# Patient Record
Sex: Female | Born: 1990 | Race: Black or African American | Hispanic: Yes | Marital: Single | Smoking: Former smoker
Health system: Southern US, Community
[De-identification: ages and names within clinical notes are randomized; demographics above are authoritative.]

## PROBLEM LIST (undated history)

## (undated) ENCOUNTER — Inpatient Hospital Stay (HOSPITAL_COMMUNITY): Payer: Self-pay

## (undated) DIAGNOSIS — O9081 Anemia of the puerperium: Secondary | ICD-10-CM

## (undated) DIAGNOSIS — Z3042 Encounter for surveillance of injectable contraceptive: Secondary | ICD-10-CM

## (undated) DIAGNOSIS — O039 Complete or unspecified spontaneous abortion without complication: Secondary | ICD-10-CM

## (undated) DIAGNOSIS — A749 Chlamydial infection, unspecified: Secondary | ICD-10-CM

## (undated) DIAGNOSIS — R011 Cardiac murmur, unspecified: Secondary | ICD-10-CM

## (undated) DIAGNOSIS — D649 Anemia, unspecified: Secondary | ICD-10-CM

## (undated) DIAGNOSIS — F329 Major depressive disorder, single episode, unspecified: Secondary | ICD-10-CM

## (undated) DIAGNOSIS — Z1379 Encounter for other screening for genetic and chromosomal anomalies: Secondary | ICD-10-CM

## (undated) DIAGNOSIS — B999 Unspecified infectious disease: Secondary | ICD-10-CM

## (undated) DIAGNOSIS — A549 Gonococcal infection, unspecified: Secondary | ICD-10-CM

## (undated) DIAGNOSIS — Z809 Family history of malignant neoplasm, unspecified: Secondary | ICD-10-CM

## (undated) HISTORY — DX: Family history of malignant neoplasm, unspecified: Z80.9

## (undated) HISTORY — DX: Encounter for other screening for genetic and chromosomal anomalies: Z13.79

## (undated) HISTORY — PX: WISDOM TOOTH EXTRACTION: SHX21

## (undated) HISTORY — DX: Cardiac murmur, unspecified: R01.1

## (undated) HISTORY — DX: Anemia, unspecified: D64.9

## (undated) HISTORY — DX: Unspecified infectious disease: B99.9

---
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
## 2009-12-13 DIAGNOSIS — F32A Depression, unspecified: Secondary | ICD-10-CM

## 2009-12-13 HISTORY — DX: Depression, unspecified: F32.A

## 2010-02-23 ENCOUNTER — Emergency Department (HOSPITAL_COMMUNITY): Admission: EM | Admit: 2010-02-23 | Discharge: 2010-02-23 | Payer: Self-pay | Admitting: Family Medicine

## 2010-04-14 ENCOUNTER — Ambulatory Visit: Payer: Self-pay | Admitting: Family Medicine

## 2010-04-14 ENCOUNTER — Encounter: Payer: Self-pay | Admitting: Emergency Medicine

## 2010-04-14 ENCOUNTER — Inpatient Hospital Stay (HOSPITAL_COMMUNITY): Admission: AD | Admit: 2010-04-14 | Discharge: 2010-04-14 | Payer: Self-pay | Admitting: Obstetrics and Gynecology

## 2010-05-13 ENCOUNTER — Ambulatory Visit: Payer: Self-pay | Admitting: Obstetrics and Gynecology

## 2010-05-14 ENCOUNTER — Encounter: Payer: Self-pay | Admitting: Obstetrics and Gynecology

## 2010-12-10 ENCOUNTER — Emergency Department (HOSPITAL_COMMUNITY)
Admission: EM | Admit: 2010-12-10 | Discharge: 2010-12-10 | Payer: Self-pay | Source: Home / Self Care | Admitting: Family Medicine

## 2010-12-13 DIAGNOSIS — D649 Anemia, unspecified: Secondary | ICD-10-CM

## 2010-12-13 HISTORY — DX: Anemia, unspecified: D64.9

## 2011-01-03 ENCOUNTER — Encounter: Payer: Self-pay | Admitting: *Deleted

## 2011-02-22 LAB — CBC
HCT: 45.3 % (ref 36.0–46.0)
Hemoglobin: 14.5 g/dL (ref 12.0–15.0)
MCHC: 32 g/dL (ref 30.0–36.0)
MCV: 81.2 fL (ref 78.0–100.0)
Platelets: 367 10*3/uL (ref 150–400)

## 2011-02-22 LAB — TSH: TSH: 1.003 u[IU]/mL (ref 0.350–4.500)

## 2011-02-22 LAB — GC/CHLAMYDIA PROBE AMP, GENITAL: Chlamydia, DNA Probe: NEGATIVE

## 2011-03-02 LAB — BASIC METABOLIC PANEL
Creatinine, Ser: 0.57 mg/dL (ref 0.4–1.2)
GFR calc non Af Amer: >60 mL/min (ref >60)
Glucose, Bld: 94 mg/dL (ref 70–99)

## 2011-03-02 LAB — URINALYSIS, ROUTINE W REFLEX MICROSCOPIC
Bilirubin Urine: NEGATIVE
Glucose, UA: NEGATIVE mg/dL
Ketones, ur: NEGATIVE mg/dL
Protein, ur: NEGATIVE mg/dL
Specific Gravity, Urine: 1.023 (ref 1.005–1.030)
Urobilinogen, UA: 1 mg/dL (ref 0.0–1.0)
pH: 6.5 (ref 5.0–8.0)

## 2011-03-02 LAB — CBC
Hemoglobin: 12.4 g/dL (ref 12.0–15.0)
RBC: 4.37 MIL/uL (ref 3.87–5.11)
RDW: 14.8 % (ref 11.5–15.5)
WBC: 6.2 10*3/uL (ref 4.0–10.5)

## 2011-03-02 LAB — WET PREP, GENITAL
Trich, Wet Prep: NONE SEEN
Yeast Wet Prep HPF POC: NONE SEEN

## 2011-03-02 LAB — HCG, QUANTITATIVE, PREGNANCY: hCG, Beta Chain, Quant, S: 4173 m[IU]/mL — ABNORMAL HIGH (ref <5)

## 2011-03-02 LAB — DIFFERENTIAL
Eosinophils Absolute: 0.2 10*3/uL (ref 0.0–0.7)
Eosinophils Relative: 3 % (ref 0–5)
Lymphocytes Relative: 25 % (ref 12–46)
Monocytes Absolute: 0.5 10*3/uL (ref 0.1–1.0)
Neutro Abs: 3.9 10*3/uL (ref 1.7–7.7)
Neutrophils Relative %: 63 % (ref 43–77)

## 2011-03-02 LAB — GC/CHLAMYDIA PROBE AMP, GENITAL: GC Probe Amp, Genital: NEGATIVE

## 2011-03-02 LAB — URINE MICROSCOPIC-ADD ON

## 2011-03-02 LAB — ABO/RH: ABO/RH(D): A POS

## 2011-03-08 LAB — WET PREP, GENITAL
Trich, Wet Prep: NONE SEEN
Yeast Wet Prep HPF POC: NONE SEEN

## 2011-03-08 LAB — POCT URINALYSIS DIP (DEVICE)
Hgb urine dipstick: NEGATIVE
Ketones, ur: NEGATIVE mg/dL
Protein, ur: NEGATIVE mg/dL
Specific Gravity, Urine: 1.02 (ref 1.005–1.030)
pH: 5.5 (ref 5.0–8.0)

## 2011-03-08 LAB — GC/CHLAMYDIA PROBE AMP, GENITAL: GC Probe Amp, Genital: NEGATIVE

## 2011-03-26 ENCOUNTER — Inpatient Hospital Stay (INDEPENDENT_AMBULATORY_CARE_PROVIDER_SITE_OTHER)
Admission: RE | Admit: 2011-03-26 | Discharge: 2011-03-26 | Disposition: A | Discharge status: Home / Self Care | Payer: Self-pay | Source: Ambulatory Visit | Attending: Emergency Medicine | Admitting: Emergency Medicine

## 2011-03-26 DIAGNOSIS — J069 Acute upper respiratory infection, unspecified: Secondary | ICD-10-CM

## 2011-03-26 LAB — CBC
MCH: 28.4 pg (ref 26.0–34.0)
MCHC: 33.8 g/dL (ref 30.0–36.0)
MCV: 83.9 fL (ref 78.0–100.0)
RBC: 4.97 MIL/uL (ref 3.87–5.11)
RDW: 13.2 % (ref 11.5–15.5)
WBC: 7.3 10*3/uL (ref 4.0–10.5)

## 2011-03-26 LAB — POCT URINALYSIS DIP (DEVICE)
Glucose, UA: NEGATIVE mg/dL
Nitrite: NEGATIVE
Urobilinogen, UA: 1 mg/dL (ref 0.0–1.0)
pH: 7 (ref 5.0–8.0)

## 2011-03-26 LAB — DIFFERENTIAL
Basophils Relative: 0 % (ref 0–1)
Eosinophils Absolute: 0 10*3/uL (ref 0.0–0.7)
Eosinophils Relative: 0 % (ref 0–5)
Monocytes Relative: 10 % (ref 3–12)
Neutrophils Relative %: 85 % — ABNORMAL HIGH (ref 43–77)

## 2011-10-15 ENCOUNTER — Emergency Department (HOSPITAL_COMMUNITY)
Admission: EM | Admit: 2011-10-15 | Discharge: 2011-10-15 | Disposition: A | Discharge status: Home / Self Care | Payer: Self-pay | Attending: Emergency Medicine | Admitting: Emergency Medicine

## 2011-10-15 DIAGNOSIS — H6690 Otitis media, unspecified, unspecified ear: Secondary | ICD-10-CM | POA: Insufficient documentation

## 2011-10-15 DIAGNOSIS — H9209 Otalgia, unspecified ear: Secondary | ICD-10-CM | POA: Insufficient documentation

## 2011-10-15 DIAGNOSIS — R059 Cough, unspecified: Secondary | ICD-10-CM | POA: Insufficient documentation

## 2011-10-15 DIAGNOSIS — J3489 Other specified disorders of nose and nasal sinuses: Secondary | ICD-10-CM | POA: Insufficient documentation

## 2011-10-15 DIAGNOSIS — J069 Acute upper respiratory infection, unspecified: Secondary | ICD-10-CM | POA: Insufficient documentation

## 2011-10-15 DIAGNOSIS — R070 Pain in throat: Secondary | ICD-10-CM | POA: Insufficient documentation

## 2011-10-15 DIAGNOSIS — R05 Cough: Secondary | ICD-10-CM | POA: Insufficient documentation

## 2011-11-08 ENCOUNTER — Inpatient Hospital Stay (HOSPITAL_COMMUNITY)
Admission: AD | Admit: 2011-11-08 | Discharge: 2011-11-08 | Disposition: A | Discharge status: Home / Self Care | Payer: Self-pay | Source: Ambulatory Visit | Attending: Obstetrics & Gynecology | Admitting: Obstetrics & Gynecology

## 2011-11-08 NOTE — ED Notes (Signed)
Pt not in lobby.  

## 2011-11-08 NOTE — Progress Notes (Signed)
Not in lobby

## 2011-11-09 ENCOUNTER — Inpatient Hospital Stay (HOSPITAL_COMMUNITY): Payer: Medicaid Other

## 2011-11-09 ENCOUNTER — Encounter (HOSPITAL_COMMUNITY): Payer: Self-pay | Admitting: *Deleted

## 2011-11-09 ENCOUNTER — Inpatient Hospital Stay (HOSPITAL_COMMUNITY)
Admission: AD | Admit: 2011-11-09 | Discharge: 2011-11-09 | Disposition: A | Discharge status: Home / Self Care | Payer: Medicaid Other | Source: Ambulatory Visit | Attending: Obstetrics & Gynecology | Admitting: Obstetrics & Gynecology

## 2011-11-09 DIAGNOSIS — Z3480 Encounter for supervision of other normal pregnancy, unspecified trimester: Secondary | ICD-10-CM

## 2011-11-09 DIAGNOSIS — N760 Acute vaginitis: Secondary | ICD-10-CM | POA: Insufficient documentation

## 2011-11-09 DIAGNOSIS — B9689 Other specified bacterial agents as the cause of diseases classified elsewhere: Secondary | ICD-10-CM | POA: Insufficient documentation

## 2011-11-09 DIAGNOSIS — R109 Unspecified abdominal pain: Secondary | ICD-10-CM | POA: Insufficient documentation

## 2011-11-09 DIAGNOSIS — A499 Bacterial infection, unspecified: Secondary | ICD-10-CM | POA: Insufficient documentation

## 2011-11-09 DIAGNOSIS — O2390 Unspecified genitourinary tract infection in pregnancy, unspecified trimester: Secondary | ICD-10-CM | POA: Insufficient documentation

## 2011-11-09 HISTORY — DX: Complete or unspecified spontaneous abortion without complication: O03.9

## 2011-11-09 HISTORY — DX: Major depressive disorder, single episode, unspecified: F32.9

## 2011-11-09 HISTORY — DX: Chlamydial infection, unspecified: A74.9

## 2011-11-09 HISTORY — DX: Gonococcal infection, unspecified: A54.9

## 2011-11-09 LAB — CBC
HCT: 35.8 % — ABNORMAL LOW (ref 36.0–46.0)
Hemoglobin: 11.3 g/dL — ABNORMAL LOW (ref 12.0–15.0)
MCV: 82.7 fL (ref 78.0–100.0)
RBC: 4.33 MIL/uL (ref 3.87–5.11)
RDW: 15.2 % (ref 11.5–15.5)
WBC: 5.4 10*3/uL (ref 4.0–10.5)

## 2011-11-09 LAB — URINALYSIS, ROUTINE W REFLEX MICROSCOPIC
Bilirubin Urine: NEGATIVE
Hgb urine dipstick: NEGATIVE
Specific Gravity, Urine: 1.02 (ref 1.005–1.030)
Urobilinogen, UA: 0.2 mg/dL (ref 0.0–1.0)

## 2011-11-09 LAB — WET PREP, GENITAL
Trich, Wet Prep: NONE SEEN
Yeast Wet Prep HPF POC: NONE SEEN

## 2011-11-09 MED ORDER — PRENATAL RX 60-1 MG PO TABS: 1.0000 | ORAL_TABLET | Freq: Every day | ORAL | Status: DC

## 2011-11-09 MED ORDER — METRONIDAZOLE 500 MG PO TABS: 500.0000 mg | ORAL_TABLET | Freq: Two times a day (BID) | ORAL | Status: AC

## 2011-11-09 MED ORDER — METRONIDAZOLE 500 MG PO TABS: 500.0000 mg | ORAL_TABLET | Freq: Once | ORAL | Status: DC

## 2011-11-09 NOTE — Progress Notes (Signed)
Not sure if preg.  Missed period. Had SAB over a year ago.  Low back pain - started 2 days ago.  Stomach hurts- cramping, started 4-5 days ago. Has been nauseated.

## 2011-11-09 NOTE — ED Provider Notes (Signed)
Durojayaih-Tanginika 89 y.o.G2P0010 @[redacted]w[redacted]d  cc: abd pain  SUBJECTIVE  HPI:  Past Medical History  Diagnosis Date  . Miscarriage   . Depression   . Chlamydia   . Gonorrhea    History reviewed. No pertinent past surgical history. History   Social History  . Marital Status: Single    Spouse Name: N/A    Number of Children: N/A  . Years of Education: N/A   Occupational History  . Not on file.   Social History Main Topics  . Smoking status: Current Everyday Smoker -- 0.2 packs/day  . Smokeless tobacco: Not on file  . Alcohol Use: No  . Drug Use: No  . Sexually Active:    Other Topics Concern  . Not on file   Social History Narrative  . No narrative on file   No current facility-administered medications on file prior to encounter.   No current outpatient prescriptions on file prior to encounter.   No Known Allergies  ROS: Pertinent items in HPI  OBJECTIVE Physical Exam  Constitutional: She is oriented to person, place, and time and well-developed, well-nourished, and in no distress. No distress.  HENT:  Head: Normocephalic.  Neck: Neck supple.  Cardiovascular: Regular rhythm.   Pulmonary/Chest: Effort normal and breath sounds normal.  Abdominal: Soft. She exhibits no mass. There is no tenderness. There is no rebound and no guarding.  Genitourinary: Uterus normal, cervix normal, right adnexa normal and left adnexa normal. Vaginal discharge found.       Copious amt creamy white-grey discharge  Musculoskeletal: Normal range of motion.  Neurological: She is alert and oriented to person, place, and time.  Skin: Skin is warm and dry.  Psychiatric: Affect normal.   BP 103/76  Pulse 56  Temp(Src) 98.8 F (37.1 C) (Oral)  Resp 18  Ht 5\' 4"  (1.626 m)  Wt 66.225 kg (146 lb)  BMI 25.06 kg/m2  SpO2 99%  LMP 09/24/2011  Results for orders placed during the hospital encounter of 11/09/11 (from the past 24 hour(s))  URINALYSIS, ROUTINE W REFLEX MICROSCOPIC      Status: Normal   Collection Time   11/09/11 11:28 AM      Component Value Range   Color, Urine YELLOW  YELLOW    Appearance CLEAR  CLEAR    Specific Gravity, Urine 1.020  1.005 - 1.030    pH 7.0  5.0 - 8.0    Glucose, UA NEGATIVE  NEGATIVE (mg/dL)   Hgb urine dipstick NEGATIVE  NEGATIVE    Bilirubin Urine NEGATIVE  NEGATIVE    Ketones, ur NEGATIVE  NEGATIVE (mg/dL)   Protein, ur NEGATIVE  NEGATIVE (mg/dL)   Urobilinogen, UA 0.2  0.0 - 1.0 (mg/dL)   Nitrite NEGATIVE  NEGATIVE    Leukocytes, UA NEGATIVE  NEGATIVE   POCT PREGNANCY, URINE     Status: Normal   Collection Time   11/09/11 11:32 AM      Component Value Range   Preg Test, Ur POSITIVE    CBC     Status: Abnormal   Collection Time   11/09/11  1:10 PM      Component Value Range   WBC 5.4  4.0 - 10.5 (K/uL)   RBC 4.33  3.87 - 5.11 (MIL/uL)   Hemoglobin 11.3 (*) 12.0 - 15.0 (g/dL)   HCT 04.5 (*) 40.9 - 46.0 (%)   MCV 82.7  78.0 - 100.0 (fL)   MCH 26.1  26.0 - 34.0 (pg)   MCHC 31.6  30.0 -  36.0 (g/dL)   RDW 16.1  09.6 - 04.5 (%)   Platelets 256  150 - 400 (K/uL)  ABO/RH     Status: Normal   Collection Time   11/09/11  1:10 PM      Component Value Range   ABO/RH(D) A POS     Microscopic wet-mount exam shows clue cells. [place]: viable IUP [redacted]w[redacted]d  ASSESSMENT Normal pregnancy 6w 4d BV    PLAN  Rx Flagyl PNV, pregnancy verification letter, list of OB providers, pregnancy precautions

## 2011-11-09 NOTE — ED Provider Notes (Signed)
Chart reviewed and agree with management and plan.  

## 2011-11-10 LAB — GC/CHLAMYDIA PROBE AMP, GENITAL
Chlamydia, DNA Probe: NEGATIVE
GC Probe Amp, Genital: NEGATIVE

## 2011-11-24 ENCOUNTER — Encounter (HOSPITAL_COMMUNITY): Payer: Self-pay | Admitting: *Deleted

## 2011-11-24 ENCOUNTER — Inpatient Hospital Stay (HOSPITAL_COMMUNITY)
Admission: AD | Admit: 2011-11-24 | Discharge: 2011-11-24 | Disposition: A | Discharge status: Home / Self Care | Payer: Self-pay | Source: Ambulatory Visit | Attending: Obstetrics & Gynecology | Admitting: Obstetrics & Gynecology

## 2011-11-24 DIAGNOSIS — O99891 Other specified diseases and conditions complicating pregnancy: Secondary | ICD-10-CM | POA: Insufficient documentation

## 2011-11-24 DIAGNOSIS — R109 Unspecified abdominal pain: Secondary | ICD-10-CM | POA: Insufficient documentation

## 2011-11-24 DIAGNOSIS — O26899 Other specified pregnancy related conditions, unspecified trimester: Secondary | ICD-10-CM

## 2011-11-24 LAB — URINALYSIS, ROUTINE W REFLEX MICROSCOPIC
Bilirubin Urine: NEGATIVE
Hgb urine dipstick: NEGATIVE
Ketones, ur: NEGATIVE mg/dL
Protein, ur: NEGATIVE mg/dL
Urobilinogen, UA: 0.2 mg/dL (ref 0.0–1.0)

## 2011-11-24 MED ORDER — IBUPROFEN 800 MG PO TABS
800.0000 mg | ORAL_TABLET | Freq: Once | ORAL | Status: AC
  Administered 2011-11-24: 800 mg via ORAL
  Filled 2011-11-24: qty 1

## 2011-11-24 NOTE — Progress Notes (Signed)
Pt reports she has been having lower abd pain and pressure for one week. Was seen here for this already. States pain has worsened since that visit. Denies dysuria. Denies bleeding.

## 2011-11-24 NOTE — ED Provider Notes (Signed)
History     Chief Complaint  Patient presents with  . Abdominal Pain   HPI 20 y.o. G2P0010 at [redacted]w[redacted]d . C/O crampy low abd pain and pressure starting this morning while at work. No bleeding or discharge. States this is the same pain she had when she was evaluated here on 11/27, normal u/s at that time, treated for BV.    Past Medical History  Diagnosis Date  . Miscarriage   . Depression   . Chlamydia   . Gonorrhea     History reviewed. No pertinent past surgical history.  History reviewed. No pertinent family history.  History  Substance Use Topics  . Smoking status: Current Everyday Smoker -- 0.2 packs/day  . Smokeless tobacco: Not on file  . Alcohol Use: No    Allergies: No Known Allergies  Prescriptions prior to admission  Medication Sig Dispense Refill  . Prenatal Vit-Fe Fumarate-FA (PRENATAL MULTIVITAMIN) 60-1 MG tablet Take 1 tablet by mouth daily.  30 tablet  4    Review of Systems  Constitutional: Negative.   Respiratory: Negative.   Cardiovascular: Negative.   Gastrointestinal: Positive for abdominal pain. Negative for nausea, vomiting, diarrhea and constipation.  Genitourinary: Negative for dysuria, urgency, frequency, hematuria and flank pain.       Negative for vaginal bleeding, vaginal discharge, dyspareunia  Musculoskeletal: Negative.   Neurological: Negative.   Psychiatric/Behavioral: Negative.    Physical Exam   Blood pressure 122/48, pulse 73, temperature 98.3 F (36.8 C), resp. rate 18, height 5\' 3"  (1.6 m), weight 154 lb (69.854 kg), last menstrual period 09/24/2011, SpO2 97.00%.  Physical Exam  Nursing note and vitals reviewed. Constitutional: She is oriented to person, place, and time. She appears well-developed and well-nourished. No distress.  HENT:  Head: Normocephalic and atraumatic.  Cardiovascular: Normal rate.   Respiratory: Effort normal.  GI: Soft. Bowel sounds are normal. She exhibits no mass. There is no tenderness. There is no  rebound and no guarding.  Genitourinary: There is no rash or lesion on the right labia. There is no rash or lesion on the left labia. Uterus is not tender. Enlarged: Size c/w dates. Cervix exhibits no motion tenderness, no discharge and no friability. Right adnexum displays no mass, no tenderness and no fullness. Left adnexum displays no mass, no tenderness and no fullness. No tenderness or bleeding around the vagina. Vaginal discharge (creamy white) found.  Musculoskeletal: Normal range of motion.  Neurological: She is alert and oriented to person, place, and time.  Skin: Skin is warm and dry.  Psychiatric: She has a normal mood and affect.    MAU Course  Procedures  Results for orders placed during the hospital encounter of 11/24/11 (from the past 24 hour(s))  URINALYSIS, ROUTINE W REFLEX MICROSCOPIC     Status: Abnormal   Collection Time   11/24/11  6:00 AM      Component Value Range   Color, Urine YELLOW  YELLOW    APPearance CLEAR  CLEAR    Specific Gravity, Urine >1.030 (*) 1.005 - 1.030    pH 5.5  5.0 - 8.0    Glucose, UA NEGATIVE  NEGATIVE (mg/dL)   Hgb urine dipstick NEGATIVE  NEGATIVE    Bilirubin Urine NEGATIVE  NEGATIVE    Ketones, ur NEGATIVE  NEGATIVE (mg/dL)   Protein, ur NEGATIVE  NEGATIVE (mg/dL)   Urobilinogen, UA 0.2  0.0 - 1.0 (mg/dL)   Nitrite NEGATIVE  NEGATIVE    Leukocytes, UA NEGATIVE  NEGATIVE   WET  PREP, GENITAL     Status: Abnormal   Collection Time   11/24/11  6:38 AM      Component Value Range   Yeast, Wet Prep NONE SEEN  NONE SEEN    Trich, Wet Prep NONE SEEN  NONE SEEN    Clue Cells, Wet Prep FEW (*) NONE SEEN    WBC, Wet Prep HPF POC FEW (*) NONE SEEN      Assessment and Plan  20 y.o. G2P0010 at [redacted]w[redacted]d Normal exam Start prenatal care as soon as possible  Zayvien Canning 11/24/2011, 7:05 AM

## 2011-12-20 ENCOUNTER — Encounter (HOSPITAL_COMMUNITY): Payer: Self-pay | Admitting: *Deleted

## 2011-12-29 ENCOUNTER — Other Ambulatory Visit (HOSPITAL_COMMUNITY): Payer: Self-pay | Admitting: Physician Assistant

## 2011-12-29 DIAGNOSIS — Z3689 Encounter for other specified antenatal screening: Secondary | ICD-10-CM

## 2011-12-29 LAB — OB RESULTS CONSOLE HGB/HCT, BLOOD: HCT: 40 %

## 2011-12-29 LAB — SICKLE CELL SCREEN: Sickle Cell Screen: NEGATIVE

## 2011-12-29 LAB — OB RESULTS CONSOLE ANTIBODY SCREEN: Antibody Screen: NEGATIVE

## 2011-12-29 LAB — OB RESULTS CONSOLE HIV ANTIBODY (ROUTINE TESTING): HIV: NONREACTIVE

## 2011-12-29 LAB — OB RESULTS CONSOLE RPR: RPR: NONREACTIVE

## 2012-02-02 ENCOUNTER — Ambulatory Visit (HOSPITAL_COMMUNITY)
Admission: RE | Admit: 2012-02-02 | Discharge: 2012-02-02 | Disposition: A | Discharge status: Home / Self Care | Payer: Medicaid Other | Source: Ambulatory Visit | Attending: Physician Assistant | Admitting: Physician Assistant

## 2012-02-02 DIAGNOSIS — Z363 Encounter for antenatal screening for malformations: Secondary | ICD-10-CM | POA: Insufficient documentation

## 2012-02-02 DIAGNOSIS — Z3689 Encounter for other specified antenatal screening: Secondary | ICD-10-CM

## 2012-02-02 DIAGNOSIS — O358XX0 Maternal care for other (suspected) fetal abnormality and damage, not applicable or unspecified: Secondary | ICD-10-CM | POA: Insufficient documentation

## 2012-02-02 DIAGNOSIS — Z1389 Encounter for screening for other disorder: Secondary | ICD-10-CM | POA: Insufficient documentation

## 2012-02-09 ENCOUNTER — Encounter (HOSPITAL_COMMUNITY): Payer: Self-pay | Admitting: *Deleted

## 2012-02-09 ENCOUNTER — Inpatient Hospital Stay (HOSPITAL_COMMUNITY)
Admission: AD | Admit: 2012-02-09 | Discharge: 2012-02-09 | Disposition: A | Discharge status: Home Health | Payer: Medicaid Other | Source: Ambulatory Visit | Attending: Obstetrics and Gynecology | Admitting: Obstetrics and Gynecology

## 2012-02-09 DIAGNOSIS — B3731 Acute candidiasis of vulva and vagina: Secondary | ICD-10-CM | POA: Insufficient documentation

## 2012-02-09 DIAGNOSIS — O2390 Unspecified genitourinary tract infection in pregnancy, unspecified trimester: Secondary | ICD-10-CM | POA: Insufficient documentation

## 2012-02-09 DIAGNOSIS — R109 Unspecified abdominal pain: Secondary | ICD-10-CM | POA: Insufficient documentation

## 2012-02-09 DIAGNOSIS — B373 Candidiasis of vulva and vagina: Secondary | ICD-10-CM | POA: Insufficient documentation

## 2012-02-09 DIAGNOSIS — N949 Unspecified condition associated with female genital organs and menstrual cycle: Secondary | ICD-10-CM

## 2012-02-09 LAB — URINALYSIS, ROUTINE W REFLEX MICROSCOPIC
Glucose, UA: NEGATIVE mg/dL
Leukocytes, UA: NEGATIVE
Nitrite: NEGATIVE
pH: 6 (ref 5.0–8.0)

## 2012-02-09 LAB — WET PREP, GENITAL: Trich, Wet Prep: NONE SEEN

## 2012-02-09 MED ORDER — FLUCONAZOLE 150 MG PO TABS: 150.0000 mg | ORAL_TABLET | Freq: Once | ORAL | Status: AC

## 2012-02-09 NOTE — ED Provider Notes (Signed)
History     Chief Complaint  Patient presents with  . Abdominal Pain   HPI 21 y.o. G2P0010 at [redacted]w[redacted]d c/o low abd pain, mostly right sided, worse with activity, better with rest. No bleeding or cramping. Prenatal care at [facility]. Normal anatomy u/s last week.    Past Medical History  Diagnosis Date  . Miscarriage   . Depression   . Chlamydia   . Gonorrhea     History reviewed. No pertinent past surgical history.  Family History  Problem Relation Age of Onset  . Diabetes Mother   . Hypertension Father   . Cancer Father   . Diabetes Father     History  Substance Use Topics  . Smoking status: Former Smoker -- 0.2 packs/day  . Smokeless tobacco: Never Used  . Alcohol Use: No    Allergies: No Known Allergies  Prescriptions prior to admission  Medication Sig Dispense Refill  . Prenatal Vit-Fe Fumarate-FA (PRENATAL MULTIVITAMIN) TABS Take 1 tablet by mouth daily.        Review of Systems  Constitutional: Negative.   Respiratory: Negative.   Cardiovascular: Negative.   Gastrointestinal: Positive for abdominal pain. Negative for nausea, vomiting, diarrhea and constipation.  Genitourinary: Negative for dysuria, urgency, frequency, hematuria and flank pain.       Negative for vaginal bleeding, cramping/contractions  Musculoskeletal: Negative.   Neurological: Negative.   Psychiatric/Behavioral: Negative.    Physical Exam   Blood pressure 117/62, pulse 76, temperature 98.1 F (36.7 C), temperature source Oral, resp. rate 20, height 5\' 3"  (1.6 m), weight 166 lb (75.297 kg), last menstrual period 09/24/2011, SpO2 100.00%.  Physical Exam  Nursing note and vitals reviewed. Constitutional: She is oriented to person, place, and time. She appears well-developed and well-nourished. No distress.  HENT:  Head: Normocephalic and atraumatic.  Cardiovascular: Normal rate.   Respiratory: Effort normal.  GI: Soft. Bowel sounds are normal. She exhibits no mass. There is no tenderness.  There is no rebound and no guarding.  Genitourinary: There is no rash or lesion on the right labia. There is no rash or lesion on the left labia. Uterus is not tender. Enlarged: Size c/w dates. Cervix exhibits no motion tenderness, no discharge and no friability. Right adnexum displays no mass, no tenderness and no fullness. Left adnexum displays no mass, no tenderness and no fullness. No tenderness or bleeding around the vagina. Vaginal discharge (white) found.  Musculoskeletal: Normal range of motion.  Neurological: She is alert and oriented to person, place, and time.  Skin: Skin is warm and dry.  Psychiatric: She has a normal mood and affect.    MAU Course  Procedures  Results for orders placed during the hospital encounter of 02/09/12 (from the past 24 hour(s))  URINALYSIS, ROUTINE W REFLEX MICROSCOPIC     Status: Abnormal   Collection Time   02/09/12  7:20 PM      Component Value Range   Color, Urine YELLOW  YELLOW    APPearance HAZY (*) CLEAR    Specific Gravity, Urine >1.030 (*) 1.005 - 1.030    pH 6.0  5.0 - 8.0    Glucose, UA NEGATIVE  NEGATIVE (mg/dL)   Hgb urine dipstick NEGATIVE  NEGATIVE    Bilirubin Urine NEGATIVE  NEGATIVE    Ketones, ur NEGATIVE  NEGATIVE (mg/dL)   Protein, ur NEGATIVE  NEGATIVE (mg/dL)   Urobilinogen, UA 0.2  0.0 - 1.0 (mg/dL)   Nitrite NEGATIVE  NEGATIVE    Leukocytes, UA NEGATIVE  NEGATIVE   WET PREP, GENITAL     Status: Abnormal   Collection Time   02/09/12  8:15 PM      Component Value Range   Yeast Wet Prep HPF POC FEW (*) NONE SEEN    Trich, Wet Prep NONE SEEN  NONE SEEN    Clue Cells Wet Prep HPF POC NONE SEEN  NONE SEEN    WBC, Wet Prep HPF POC MODERATE (*) NONE SEEN      Assessment and Plan  21 y.o. G2P0010 at [redacted]w[redacted]d Round ligament pain Yeast - rx diflucan F/U as scheduled  Mignonne Afonso 02/09/2012, 8:14 PM

## 2012-02-09 NOTE — Progress Notes (Signed)
Pt states she has general abdominal discomfort and wants to make sure everything is OK

## 2012-02-12 LAB — GC/CHLAMYDIA PROBE AMP, GENITAL: GC Probe Amp, Genital: NEGATIVE

## 2012-02-15 NOTE — ED Provider Notes (Signed)
Attestation of Attending Supervision of Advanced Practitioner: Evaluation and management procedures were performed by the PA/NP/CNM/OB Fellow under my supervision/collaboration. Chart reviewed and agree with management and plan.  Stefanos Haynesworth V 02/15/2012 10:42 AM

## 2012-04-01 ENCOUNTER — Inpatient Hospital Stay (HOSPITAL_COMMUNITY)
Admission: AD | Admit: 2012-04-01 | Discharge: 2012-04-01 | Disposition: A | Discharge status: Home / Self Care | Payer: Medicaid Other | Source: Ambulatory Visit | Attending: Obstetrics & Gynecology | Admitting: Obstetrics & Gynecology

## 2012-04-01 ENCOUNTER — Encounter (HOSPITAL_COMMUNITY): Payer: Self-pay | Admitting: *Deleted

## 2012-04-01 DIAGNOSIS — R1013 Epigastric pain: Secondary | ICD-10-CM | POA: Insufficient documentation

## 2012-04-01 DIAGNOSIS — K219 Gastro-esophageal reflux disease without esophagitis: Secondary | ICD-10-CM | POA: Insufficient documentation

## 2012-04-01 DIAGNOSIS — O99891 Other specified diseases and conditions complicating pregnancy: Secondary | ICD-10-CM | POA: Insufficient documentation

## 2012-04-01 LAB — URINALYSIS, ROUTINE W REFLEX MICROSCOPIC
Glucose, UA: NEGATIVE mg/dL
Hgb urine dipstick: NEGATIVE
Protein, ur: NEGATIVE mg/dL
pH: 6 (ref 5.0–8.0)

## 2012-04-01 MED ORDER — RANITIDINE HCL 150 MG PO TABS: 150.0000 mg | ORAL_TABLET | Freq: Two times a day (BID) | ORAL | Status: DC

## 2012-04-01 MED ORDER — CALCIUM CARBONATE ANTACID 500 MG PO CHEW: 1.0000 | CHEWABLE_TABLET | Freq: Three times a day (TID) | ORAL | Status: DC

## 2012-04-01 MED ORDER — GI COCKTAIL ~~LOC~~
30.0000 mL | Freq: Once | ORAL | Status: AC
  Administered 2012-04-01: 30 mL via ORAL
  Filled 2012-04-01: qty 30

## 2012-04-01 NOTE — MAU Note (Signed)
Pt states, " I've had a sharp shooting pain in my upper abdomen about every 30 minutes all day. Yesterday it happened one time and it was the worst one ever. It lasted about ten seconds."

## 2012-04-01 NOTE — MAU Note (Signed)
Dr Rivka Safer notified of pt's admission and status. Aware of upper abd pain intermittently since yesterday and decreased FM today.

## 2012-04-01 NOTE — MAU Provider Note (Signed)
  History     CSN: [id number]  Arrival date and time: 04/01/12 2140   Chief Complaint  Patient presents with  . Abdominal Pain  epigastric abdominal pain.  HPI Patient is a 21 y/o with report of epigastric abdominal pain and nausea. No vomiting. She is spitting clear fluid into a vomit bag. She says the pain started yesterday. It is a burning pain in the epigastric region. Relieved temporarily by GI cocktail in ED. Has had this type of epigastric pain before. She is not hungry, but can eat. No lower abdominal pain. No tenderness. Normal physical exam.No fevers, no GI symptoms, no GU symptoms.  No vaginal bleeding, baby moving well, no discharge, no water break.   OB History    Grav Para Term Preterm Abortions TAB SAB Ect Mult Living   2    1  1          Past Medical History  Diagnosis Date  . Miscarriage   . Depression   . Chlamydia   . Gonorrhea     Past Surgical History  Procedure Date  . No past surgeries     Family History  Problem Relation Age of Onset  . Diabetes Mother   . Hypertension Father   . Cancer Father   . Diabetes Father   . Anesthesia problems Neg Hx   . Hypotension Neg Hx   . Malignant hyperthermia Neg Hx   . Pseudochol deficiency Neg Hx     History  Substance Use Topics  . Smoking status: Former Smoker -- 0.2 packs/day    Quit date: 11/02/2011  . Smokeless tobacco: Never Used  . Alcohol Use: No    Allergies: No Known Allergies  Prescriptions prior to admission  Medication Sig Dispense Refill  . Prenatal Vit-Fe Fumarate-FA (PRENATAL MULTIVITAMIN) TABS Take 1 tablet by mouth daily.       ROS Physical Exam   Blood pressure 138/53, pulse 75, temperature 98.6 F (37 C), temperature source Oral, resp. rate 20, height 5\' 3"  (1.6 m), weight 80.287 kg (177 lb), last menstrual period 09/24/2011.  Physical Exam General: aaf, Not in distress Lungs:  Normal respiratory effort, chest expands symmetrically. Lungs are clear to auscultation, no  crackles or wheezes. Heart - Regular rate and rhythm.  No murmurs, gallops or rubs.    Abdomen: soft and non-tender without masses, organomegaly or hernias noted.  No guarding or rebound. Gravid.  Extremities:   Non-tender, No cyanosis, edema, or deformity noted. Skin:  Intact without suspicious lesions or rashes Mucous membranes moist, normal cap refill.  MAU Course  Procedures Doing well. Rare contractions on strip q 8 minutes. Patient cannot feel them. Baby tracing reassuring without decels.   Assessment and Plan  21 y/o G2P0010 with GERD  - patient responded to GI Cocktail and has had this pain in the past. - no red flags for GU/GYN/OB pathology. - deferred vaginal exam since she did not have any symptoms on her history - will [facility] - Will RX: Zantac and tums  ORTON,JONATHAN MD 04/01/2012, 11:16 PM   I examined pt and agree with documentation above and resident plan of care. [facility]

## 2012-04-01 NOTE — MAU Note (Signed)
Dr Rivka Safer notified that pt feels better after GI cocktail and is having some ctxs. Perceives ctxs as tightening but not painful. Will see pt

## 2012-04-01 NOTE — Progress Notes (Signed)
Written and verbal d/c instructions given and understanding voiced. 

## 2012-04-05 LAB — OB RESULTS CONSOLE HGB/HCT, BLOOD
HCT: 35 %
Hemoglobin: 10.9 g/dL

## 2012-04-05 LAB — OB RESULTS CONSOLE HIV ANTIBODY (ROUTINE TESTING): HIV: NONREACTIVE

## 2012-05-03 ENCOUNTER — Encounter: Payer: Self-pay | Admitting: Obstetrics and Gynecology

## 2012-05-03 ENCOUNTER — Ambulatory Visit (INDEPENDENT_AMBULATORY_CARE_PROVIDER_SITE_OTHER): Payer: Medicaid Other | Admitting: Obstetrics and Gynecology

## 2012-05-03 DIAGNOSIS — Z331 Pregnant state, incidental: Secondary | ICD-10-CM

## 2012-05-03 NOTE — Progress Notes (Signed)
[facility].  Lab results requested.

## 2012-05-11 ENCOUNTER — Ambulatory Visit (INDEPENDENT_AMBULATORY_CARE_PROVIDER_SITE_OTHER): Payer: Medicaid Other | Admitting: Obstetrics and Gynecology

## 2012-05-11 ENCOUNTER — Encounter: Payer: Self-pay | Admitting: Obstetrics and Gynecology

## 2012-05-11 VITALS — BP 118/60 | Ht 63.0 in | Wt 185.0 lb

## 2012-05-11 DIAGNOSIS — Z331 Pregnant state, incidental: Secondary | ICD-10-CM

## 2012-05-11 DIAGNOSIS — Z3490 Encounter for supervision of normal pregnancy, unspecified, unspecified trimester: Secondary | ICD-10-CM

## 2012-05-11 DIAGNOSIS — R7309 Other abnormal glucose: Secondary | ICD-10-CM

## 2012-05-11 DIAGNOSIS — O99810 Abnormal glucose complicating pregnancy: Secondary | ICD-10-CM

## 2012-05-11 NOTE — Progress Notes (Signed)
C/o lots of pressure x 3 days

## 2012-05-12 DIAGNOSIS — O480 Post-term pregnancy: Secondary | ICD-10-CM | POA: Insufficient documentation

## 2012-05-12 DIAGNOSIS — R7309 Other abnormal glucose: Secondary | ICD-10-CM | POA: Insufficient documentation

## 2012-05-12 NOTE — Progress Notes (Signed)
  Subjective:    Rachel [name] is being seen today for her first obstetrical visit.  This is not a planned pregnancy. She is at [redacted]w[redacted]d gestation. Her obstetrical history is significant for ttransfer of care in the third trimester.  . Patient does intend to breast feed. Pregnancy history fully reviewed.  Patient reports pelvic pressure for the last 3 days especially when she is sitting. She has no additional pressure when she is up and walking around.  She does have 2 part-time jobs working a total of 34 hours per week.  Review of Systems:   Review of Systems  Constitutional: Negative.   HENT: Negative.   Eyes: Negative.   Respiratory: Negative.   Cardiovascular: Negative.   Gastrointestinal: Negative.   Genitourinary: Negative.   Musculoskeletal: Negative.   Neurological: Negative.   Hematological: Negative.   Psychiatric/Behavioral: Negative.     Objective:     BP 118/60  Ht 5\' 3"  (1.6 m)  Wt 185 lb (83.915 kg)  BMI 32.77 kg/m2  LMP 09/24/2011 Physical Exam  Constitutional: She is oriented to person, place, and time. She appears well-developed and well-nourished.  HENT:  Head: Normocephalic and atraumatic.  Eyes: Conjunctivae and EOM are normal.  Neck: Normal range of motion. Neck supple.  Cardiovascular: Normal rate, regular rhythm and normal heart sounds.   Respiratory: Effort normal and breath sounds normal.  GI: Soft.       Gravid with 34 cm fundal height  Genitourinary: Vagina normal.  Musculoskeletal: Normal range of motion.  Neurological: She is alert and oriented to person, place, and time. She has normal reflexes.  Skin: Skin is warm and dry.  Psychiatric: She has a normal mood and affect.    Exam    Assessment:    Pregnancy: G2P0010 Patient Active Problem List  Diagnoses  . Normal pregnancy  . Abnormal glucose       Plan:    [facility]  on 12/29/2011.labs were reviewed and required no further action Prenatal vitamins to be continued. Problem list reviewed and updated.  patient had a normal quad screen. Role of ultrasound in pregnancy discussed; fetal survey: done at 18 weeks at [facility] Follow up in 2 weeks. 80% of 30 min visit spent on counseling and coordination of care.     Casey Fye P 05/12/2012

## 2012-05-14 DIAGNOSIS — O368190 Decreased fetal movements, unspecified trimester, not applicable or unspecified: Secondary | ICD-10-CM

## 2012-05-15 ENCOUNTER — Inpatient Hospital Stay (HOSPITAL_COMMUNITY)
Admission: AD | Admit: 2012-05-15 | Discharge: 2012-05-15 | Disposition: A | Discharge status: Home / Self Care | Payer: Medicaid Other | Source: Ambulatory Visit | Attending: Obstetrics and Gynecology | Admitting: Obstetrics and Gynecology

## 2012-05-15 ENCOUNTER — Encounter (HOSPITAL_COMMUNITY): Payer: Self-pay | Admitting: *Deleted

## 2012-05-15 DIAGNOSIS — O368190 Decreased fetal movements, unspecified trimester, not applicable or unspecified: Secondary | ICD-10-CM | POA: Insufficient documentation

## 2012-05-15 DIAGNOSIS — F329 Major depressive disorder, single episode, unspecified: Secondary | ICD-10-CM | POA: Insufficient documentation

## 2012-05-15 DIAGNOSIS — R011 Cardiac murmur, unspecified: Secondary | ICD-10-CM | POA: Insufficient documentation

## 2012-05-15 DIAGNOSIS — D649 Anemia, unspecified: Secondary | ICD-10-CM | POA: Insufficient documentation

## 2012-05-15 DIAGNOSIS — R109 Unspecified abdominal pain: Secondary | ICD-10-CM | POA: Insufficient documentation

## 2012-05-15 DIAGNOSIS — Z8619 Personal history of other infectious and parasitic diseases: Secondary | ICD-10-CM | POA: Insufficient documentation

## 2012-05-15 DIAGNOSIS — O4700 False labor before 37 completed weeks of gestation, unspecified trimester: Secondary | ICD-10-CM | POA: Insufficient documentation

## 2012-05-15 LAB — FETAL FIBRONECTIN: Fetal Fibronectin: NEGATIVE

## 2012-05-15 LAB — URINALYSIS, ROUTINE W REFLEX MICROSCOPIC
Bilirubin Urine: NEGATIVE
Glucose, UA: NEGATIVE mg/dL
Ketones, ur: NEGATIVE mg/dL
Protein, ur: NEGATIVE mg/dL

## 2012-05-15 LAB — WET PREP, GENITAL
Trich, Wet Prep: NONE SEEN
Yeast Wet Prep HPF POC: NONE SEEN

## 2012-05-15 LAB — URINE MICROSCOPIC-ADD ON

## 2012-05-15 MED ORDER — METRONIDAZOLE 500 MG PO TABS: 500.0000 mg | ORAL_TABLET | Freq: Two times a day (BID) | ORAL | Status: AC

## 2012-05-15 MED ORDER — NITROFURANTOIN MONOHYD MACRO 100 MG PO CAPS: 100.0000 mg | ORAL_CAPSULE | Freq: Two times a day (BID) | ORAL | Status: AC

## 2012-05-15 NOTE — MAU Note (Signed)
Pt reports no fetal movement x 13.5 hours. Lower abd pain x 3 days, denies bleeding or gush of fluid.

## 2012-05-15 NOTE — MAU Note (Signed)
PT SAYS SHE FELT BABY MOVE LAST  ON Sunday AFTERNOON AT 12NOON.   ALSO FEELS CRAMPS  OCC.  FEELS PRESSURE. IN OFFICE  LAST Thursday.  NEXT APPOINTMENT ON 6-10

## 2012-05-15 NOTE — MAU Provider Note (Signed)
History     CSN: [id number]  Arrival date and time: 05/15/12 4098   First Provider Initiated Contact with Patient 05/15/12 0445      Chief Complaint  Patient presents with  . Decreased Fetal Movement   HPI Comments: Pt is a G2P0 at [redacted]w[redacted]d that arrives unannounced w cc of dec FM, since Sunday afternoon. Also c/o pelvic cramping the last 3 days, states it is worse at night after coming home from work and has been keeping her awake. She's unsure about ctx, she denies VB, LOF, or D/C. Has felt more FM since being on the monitor. Pt had her [facility] visit at [facility] on 5-[facility], [facility]  Pregnancy has been uncomplicated. She denies recent IC.       Past Medical History  Diagnosis Date  . Miscarriage   . Chlamydia   . Gonorrhea   . Depression 2011    NO COUNSELING OR MEDS  . Infection 2009    CHLAMYDIA  . Infection 2009    GC  . Infection     YEAST X1  . Anemia 2012  . Heart murmur     SINCE BIRTH    Past Surgical History  Procedure Date  . Wisdom tooth extraction     AGE 21    Family History  Problem Relation Age of Onset  . Hypertension Mother   . Miscarriages / [place] Mother   . Hypertension Father   . Diabetes Father   . Heart disease Father   . Kidney disease Father     MASS; KIDNEY REMOVED  . Stroke Father   . Anesthesia problems Neg Hx   . Hypotension Neg Hx   . Malignant hyperthermia Neg Hx   . Pseudochol deficiency Neg Hx   . Asthma Sister   . Cancer Sister     STOMACH  . Asthma Brother   . Cancer Paternal Grandmother     STOMACH  . Lupus Sister   . Lupus Cousin     History  Substance Use Topics  . Smoking status: Former Smoker -- 0.2 packs/day    Quit date: 11/02/2011  . Smokeless tobacco: Never Used  . Alcohol Use: No    Allergies: No Known Allergies  Prescriptions prior to admission  Medication Sig Dispense Refill  . calcium carbonate (TUMS) 500 MG chewable tablet Chew 1 tablet (200 mg of elemental calcium  total) by mouth 3 (three) times daily.  30 tablet  0  . Prenatal Vit-Fe Fumarate-FA (PRENATAL MULTIVITAMIN) TABS Take 1 tablet by mouth daily.      . ranitidine (ZANTAC) 150 MG tablet Take 1 tablet (150 mg total) by mouth 2 (two) times daily.  60 tablet  0    Review of Systems  Gastrointestinal: Positive for abdominal pain.       Pelvic cramping  All other systems reviewed and are negative.   Physical Exam   Blood pressure 127/60, pulse 92, temperature 98.8 F (37.1 C), temperature source Oral, resp. rate 20, height 5\' 3"  (1.6 m), weight 187 lb (84.823 kg), last menstrual period 09/24/2011, SpO2 99.00%.  Physical Exam  Nursing note and vitals reviewed. Constitutional: She is oriented to person, place, and time. She appears well-developed and well-nourished.  HENT:  Head: Normocephalic.  Neck: Normal range of motion.  Cardiovascular: Normal rate, regular rhythm and normal heart sounds.   Respiratory: Effort normal and breath sounds normal.  GI: Soft. Bowel sounds are normal.  Gravid AGA  Genitourinary: Vaginal discharge found.       sm amt milky d/c cx cl/th/high firm  Musculoskeletal: Normal range of motion. She exhibits no edema.  Neurological: She is alert and oriented to person, place, and time.  Skin: Skin is warm and dry.  Psychiatric: She has a normal mood and affect. Her behavior is normal.   FHR 140 reactive cat 1 toco some UI, and irreg UC MAU Course  Procedures    Assessment and Plan  IUP at [redacted]w[redacted]d FHR reassuring GC/CT, wet prep, FFN sent  UA +leuk, will send for cx PO hydrate   Avraj Lindroth M 05/15/2012, 4:58 AM   Addendum: Wet prep +clue FFN neg No regular ctx, UI better after PO hydration Will D/C home w RX for macrobid, flagyl Will send UA for cx F/U office this week [facility]KC and PTL sx's rv'd

## 2012-05-15 NOTE — Discharge Instructions (Signed)
Bacterial Vaginosis Bacterial vaginosis (BV) is a vaginal infection where the normal balance of bacteria in the vagina is disrupted. The normal balance is then replaced by an overgrowth of certain bacteria. There are several different kinds of bacteria that can cause BV. BV is the most common vaginal infection in women of childbearing age. CAUSES   The cause of BV is not fully understood. BV develops when there is an increase or imbalance of harmful bacteria.   Some activities or behaviors can upset the normal balance of bacteria in the vagina and put women at increased risk including:   Having a new sex partner or multiple sex partners.   Douching.   Using an intrauterine device (IUD) for contraception.   It is not clear what role sexual activity plays in the development of BV. However, women that have never had sexual intercourse are rarely infected with BV.  Women do not get BV from toilet seats, bedding, swimming pools or from touching objects around them.  SYMPTOMS   Grey vaginal discharge.   A fish-like odor with discharge, especially after sexual intercourse.   Itching or burning of the vagina and vulva.   Burning or pain with urination.   Some women have no signs or symptoms at all.  DIAGNOSIS  Your caregiver must examine the vagina for signs of BV. Your caregiver will perform lab tests and look at the sample of vaginal fluid through a microscope. They will look for bacteria and abnormal cells (clue cells), a pH test higher than 4.5, and a positive amine test all associated with BV.  RISKS AND COMPLICATIONS   Pelvic inflammatory disease (PID).   Infections following gynecology surgery.   Developing HIV.   Developing herpes virus.  TREATMENT  Sometimes BV will clear up without treatment. However, all women with symptoms of BV should be treated to avoid complications, especially if gynecology surgery is planned. Female partners generally do not need to be treated. However,  BV may spread between female sex partners so treatment is helpful in preventing a recurrence of BV.   BV may be treated with antibiotics. The antibiotics come in either pill or vaginal cream forms. Either can be used with nonpregnant or pregnant women, but the recommended dosages differ. These antibiotics are not harmful to the baby.   BV can recur after treatment. If this happens, a second round of antibiotics will often be prescribed.   Treatment is important for pregnant women. If not treated, BV can cause a premature delivery, especially for a pregnant woman who had a premature birth in the past. All pregnant women who have symptoms of BV should be checked and treated.   For chronic reoccurrence of BV, treatment with a type of prescribed gel vaginally twice a week is helpful.  HOME CARE INSTRUCTIONS   Finish all medication as directed by your caregiver.   Do not have sex until treatment is completed.   Tell your sexual partner that you have a vaginal infection. They should see their caregiver and be treated if they have problems, such as a mild rash or itching.   Practice safe sex. Use condoms. Only have 1 sex partner.  PREVENTION  Basic prevention steps can help reduce the risk of upsetting the natural balance of bacteria in the vagina and developing BV:  Do not have sexual intercourse (be abstinent).   Do not douche.   Use all of the medicine prescribed for treatment of BV, even if the signs and symptoms go away.     Tell your sex partner if you have BV. That way, they can be treated, if needed, to prevent reoccurrence.  SEEK MEDICAL CARE IF:   Your symptoms are not improving after 3 days of treatment.   You have increased discharge, pain, or fever.  MAKE SURE YOU:   Understand these instructions.   Will watch your condition.   Will get help right away if you are not doing well or get worse.  FOR MORE INFORMATION  Division of STD Prevention (DSTDP), Centers for Disease  Control and Prevention: [employer] American Social Health Association (ASHA): www.ashastd.org  Document Released: 11/29/2005 Document Revised: 11/18/2011 Document Reviewed: 05/22/2009 [facility] Patient Information 2012 [place], [place].Preventing Preterm Labor Preterm labor is when a pregnant woman has contractions that cause the cervix to open, shorten, and thin before 37 weeks of pregnancy. You will have regular contractions (tightening) 2 to 3 minutes apart. This usually causes discomfort or pain. HOME CARE  Eat a healthy diet.   Take your vitamins as told by your doctor.   Drink enough fluids to keep your pee (urine) clear or pale yellow every day.   Get rest and sleep.   Do not have sex if you are at high risk for preterm labor.   Follow your doctor's advice about activity, medicines, and tests.   Avoid stress.   Avoid hard labor or exercise that lasts for a long time.   Do not smoke.  GET HELP RIGHT AWAY IF:   You are having contractions.   You have belly (abdominal) pain.   You have bleeding from your vagina.   You have pain when you pee (urinate).   You have abnormal discharge from your vagina.   You have a temperature by mouth above 102 F (38.9 C).  MAKE SURE YOU:  Understand these instructions.   Will watch your condition.   Will get help if you are not doing well or get worse.  Document Released: 02/25/2009 Document Revised: 11/18/2011 Document Reviewed: 02/25/2009 [facility] Patient Information 2012 [place], [place].Fetal Movement Counts Patient Name: __________________________________________________ Patient Due Date: ____________________ Melody Haver counts is highly recommended in high risk pregnancies, but it is a good idea for every pregnant woman to do. Start counting fetal movements at 28 weeks of the pregnancy. Fetal movements increase after eating a full meal or eating or drinking something sweet (the blood sugar is higher). It is also important to drink  plenty of fluids (well hydrated) before doing the count. Lie on your left side because it helps with the circulation or you can sit in a comfortable chair with your arms over your belly (abdomen) with no distractions around you. DOING THE COUNT  Try to do the count the same time of day each time you do it.   Mark the day and time, then see how long it takes for you to feel 10 movements (kicks, flutters, swishes, rolls). You should have at least 10 movements within 2 hours. You will most likely feel 10 movements in much less than 2 hours. If you do not, wait an hour and count again. After a couple of days you will see a pattern.   What you are looking for is a change in the pattern or not enough counts in 2 hours. Is it taking longer in time to reach 10 movements?  SEEK MEDICAL CARE IF:  You feel less than 10 counts in 2 hours. Tried twice.   No movement in one hour.   The pattern is changing or taking  longer each day to reach 10 counts in 2 hours.   You feel the baby is not moving as it usually does.  Date: ____________ Movements: ____________ Start time: ____________ Doreatha Martin time: ____________  Date: ____________ Movements: ____________ Start time: ____________ Doreatha Martin time: ____________ Date: ____________ Movements: ____________ Start time: ____________ Doreatha Martin time: ____________ Date: ____________ Movements: ____________ Start time: ____________ Doreatha Martin time: ____________ Date: ____________ Movements: ____________ Start time: ____________ Doreatha Martin time: ____________ Date: ____________ Movements: ____________ Start time: ____________ Doreatha Martin time: ____________ Date: ____________ Movements: ____________ Start time: ____________ Doreatha Martin time: ____________ Date: ____________ Movements: ____________ Start time: ____________ Doreatha Martin time: ____________  Date: ____________ Movements: ____________ Start time: ____________ Doreatha Martin time: ____________ Date: ____________ Movements: ____________ Start time:  ____________ Doreatha Martin time: ____________ Date: ____________ Movements: ____________ Start time: ____________ Doreatha Martin time: ____________ Date: ____________ Movements: ____________ Start time: ____________ Doreatha Martin time: ____________ Date: ____________ Movements: ____________ Start time: ____________ Doreatha Martin time: ____________ Date: ____________ Movements: ____________ Start time: ____________ Doreatha Martin time: ____________ Date: ____________ Movements: ____________ Start time: ____________ Doreatha Martin time: ____________  Date: ____________ Movements: ____________ Start time: ____________ Doreatha Martin time: ____________ Date: ____________ Movements: ____________ Start time: ____________ Doreatha Martin time: ____________ Date: ____________ Movements: ____________ Start time: ____________ Doreatha Martin time: ____________ Date: ____________ Movements: ____________ Start time: ____________ Doreatha Martin time: ____________ Date: ____________ Movements: ____________ Start time: ____________ Doreatha Martin time: ____________ Date: ____________ Movements: ____________ Start time: ____________ Doreatha Martin time: ____________ Date: ____________ Movements: ____________ Start time: ____________ Doreatha Martin time: ____________  Date: ____________ Movements: ____________ Start time: ____________ Doreatha Martin time: ____________ Date: ____________ Movements: ____________ Start time: ____________ Doreatha Martin time: ____________ Date: ____________ Movements: ____________ Start time: ____________ Doreatha Martin time: ____________ Date: ____________ Movements: ____________ Start time: ____________ Doreatha Martin time: ____________ Date: ____________ Movements: ____________ Start time: ____________ Doreatha Martin time: ____________ Date: ____________ Movements: ____________ Start time: ____________ Doreatha Martin time: ____________ Date: ____________ Movements: ____________ Start time: ____________ Doreatha Martin time: ____________  Date: ____________ Movements: ____________ Start time: ____________ Doreatha Martin time: ____________ Date:  ____________ Movements: ____________ Start time: ____________ Doreatha Martin time: ____________ Date: ____________ Movements: ____________ Start time: ____________ Doreatha Martin time: ____________ Date: ____________ Movements: ____________ Start time: ____________ Doreatha Martin time: ____________ Date: ____________ Movements: ____________ Start time: ____________ Doreatha Martin time: ____________ Date: ____________ Movements: ____________ Start time: ____________ Doreatha Martin time: ____________ Date: ____________ Movements: ____________ Start time: ____________ Doreatha Martin time: ____________  Date: ____________ Movements: ____________ Start time: ____________ Doreatha Martin time: ____________ Date: ____________ Movements: ____________ Start time: ____________ Doreatha Martin time: ____________ Date: ____________ Movements: ____________ Start time: ____________ Doreatha Martin time: ____________ Date: ____________ Movements: ____________ Start time: ____________ Doreatha Martin time: ____________ Date: ____________ Movements: ____________ Start time: ____________ Doreatha Martin time: ____________ Date: ____________ Movements: ____________ Start time: ____________ Doreatha Martin time: ____________ Date: ____________ Movements: ____________ Start time: ____________ Doreatha Martin time: ____________  Date: ____________ Movements: ____________ Start time: ____________ Doreatha Martin time: ____________ Date: ____________ Movements: ____________ Start time: ____________ Doreatha Martin time: ____________ Date: ____________ Movements: ____________ Start time: ____________ Doreatha Martin time: ____________ Date: ____________ Movements: ____________ Start time: ____________ Doreatha Martin time: ____________ Date: ____________ Movements: ____________ Start time: ____________ Doreatha Martin time: ____________ Date: ____________ Movements: ____________ Start time: ____________ Doreatha Martin time: ____________ Date: ____________ Movements: ____________ Start time: ____________ Doreatha Martin time: ____________  Date: ____________ Movements: ____________ Start  time: ____________ Doreatha Martin time: ____________ Date: ____________ Movements: ____________ Start time: ____________ Doreatha Martin time: ____________ Date: ____________ Movements: ____________ Start time: ____________ Doreatha Martin time: ____________ Date: ____________ Movements: ____________ Start time: ____________ Doreatha Martin time: ____________ Date: ____________ Movements: ____________ Start time: ____________ Doreatha Martin time: ____________ Date: ____________ Movements: ____________ Start time: ____________ Doreatha Martin time: ____________ Document Released: 12/29/2006 Document Revised:  11/18/2011 Document Reviewed: 07/01/2009 [facility] Patient Information 2012 [place], [place].

## 2012-05-16 ENCOUNTER — Encounter: Payer: Self-pay | Admitting: Obstetrics and Gynecology

## 2012-05-16 ENCOUNTER — Ambulatory Visit (INDEPENDENT_AMBULATORY_CARE_PROVIDER_SITE_OTHER): Payer: Medicaid Other | Admitting: Obstetrics and Gynecology

## 2012-05-16 VITALS — BP 122/60 | Wt 188.0 lb

## 2012-05-16 DIAGNOSIS — Z331 Pregnant state, incidental: Secondary | ICD-10-CM

## 2012-05-16 LAB — URINE CULTURE
Colony Count: 25000
Culture  Setup Time: 201306031036

## 2012-05-16 LAB — GC/CHLAMYDIA PROBE AMP, GENITAL: Chlamydia, DNA Probe: NEGATIVE

## 2012-05-16 NOTE — Patient Instructions (Signed)
Fetal Movement Counts Patient Name: __________________________________________________ Patient Due Date: ____________________ Kick counts is highly recommended in high risk pregnancies, but it is a good idea for every pregnant woman to do. Start counting fetal movements at 28 weeks of the pregnancy. Fetal movements increase after eating a full meal or eating or drinking something sweet (the blood sugar is higher). It is also important to drink plenty of fluids (well hydrated) before doing the count. Lie on your left side because it helps with the circulation or you can sit in a comfortable chair with your arms over your belly (abdomen) with no distractions around you. DOING THE COUNT  Try to do the count the same time of day each time you do it.   Mark the day and time, then see how long it takes for you to feel 10 movements (kicks, flutters, swishes, rolls). You should have at least 10 movements within 2 hours. You will most likely feel 10 movements in much less than 2 hours. If you do not, wait an hour and count again. After a couple of days you will see a pattern.   What you are looking for is a change in the pattern or not enough counts in 2 hours. Is it taking longer in time to reach 10 movements?  SEEK MEDICAL CARE IF:  You feel less than 10 counts in 2 hours. Tried twice.   No movement in one hour.   The pattern is changing or taking longer each day to reach 10 counts in 2 hours.   You feel the baby is not moving as it usually does.  Date: ____________ Movements: ____________ Start time: ____________ Finish time: ____________  Date: ____________ Movements: ____________ Start time: ____________ Finish time: ____________ Date: ____________ Movements: ____________ Start time: ____________ Finish time: ____________ Date: ____________ Movements: ____________ Start time: ____________ Finish time: ____________ Date: ____________ Movements: ____________ Start time: ____________ Finish time:  ____________ Date: ____________ Movements: ____________ Start time: ____________ Finish time: ____________ Date: ____________ Movements: ____________ Start time: ____________ Finish time: ____________ Date: ____________ Movements: ____________ Start time: ____________ Finish time: ____________  Date: ____________ Movements: ____________ Start time: ____________ Finish time: ____________ Date: ____________ Movements: ____________ Start time: ____________ Finish time: ____________ Date: ____________ Movements: ____________ Start time: ____________ Finish time: ____________ Date: ____________ Movements: ____________ Start time: ____________ Finish time: ____________ Date: ____________ Movements: ____________ Start time: ____________ Finish time: ____________ Date: ____________ Movements: ____________ Start time: ____________ Finish time: ____________ Date: ____________ Movements: ____________ Start time: ____________ Finish time: ____________  Date: ____________ Movements: ____________ Start time: ____________ Finish time: ____________ Date: ____________ Movements: ____________ Start time: ____________ Finish time: ____________ Date: ____________ Movements: ____________ Start time: ____________ Finish time: ____________ Date: ____________ Movements: ____________ Start time: ____________ Finish time: ____________ Date: ____________ Movements: ____________ Start time: ____________ Finish time: ____________ Date: ____________ Movements: ____________ Start time: ____________ Finish time: ____________ Date: ____________ Movements: ____________ Start time: ____________ Finish time: ____________  Date: ____________ Movements: ____________ Start time: ____________ Finish time: ____________ Date: ____________ Movements: ____________ Start time: ____________ Finish time: ____________ Date: ____________ Movements: ____________ Start time: ____________ Finish time: ____________ Date: ____________ Movements:  ____________ Start time: ____________ Finish time: ____________ Date: ____________ Movements: ____________ Start time: ____________ Finish time: ____________ Date: ____________ Movements: ____________ Start time: ____________ Finish time: ____________ Date: ____________ Movements: ____________ Start time: ____________ Finish time: ____________  Date: ____________ Movements: ____________ Start time: ____________ Finish time: ____________ Date: ____________ Movements: ____________ Start time: ____________ Finish time: ____________ Date: ____________ Movements: ____________ Start time:   ____________ Finish time: ____________ Date: ____________ Movements: ____________ Start time: ____________ Finish time: ____________ Date: ____________ Movements: ____________ Start time: ____________ Finish time: ____________ Date: ____________ Movements: ____________ Start time: ____________ Finish time: ____________ Date: ____________ Movements: ____________ Start time: ____________ Finish time: ____________  Date: ____________ Movements: ____________ Start time: ____________ Finish time: ____________ Date: ____________ Movements: ____________ Start time: ____________ Finish time: ____________ Date: ____________ Movements: ____________ Start time: ____________ Finish time: ____________ Date: ____________ Movements: ____________ Start time: ____________ Finish time: ____________ Date: ____________ Movements: ____________ Start time: ____________ Finish time: ____________ Date: ____________ Movements: ____________ Start time: ____________ Finish time: ____________ Date: ____________ Movements: ____________ Start time: ____________ Finish time: ____________  Date: ____________ Movements: ____________ Start time: ____________ Finish time: ____________ Date: ____________ Movements: ____________ Start time: ____________ Finish time: ____________ Date: ____________ Movements: ____________ Start time: ____________ Finish  time: ____________ Date: ____________ Movements: ____________ Start time: ____________ Finish time: ____________ Date: ____________ Movements: ____________ Start time: ____________ Finish time: ____________ Date: ____________ Movements: ____________ Start time: ____________ Finish time: ____________ Date: ____________ Movements: ____________ Start time: ____________ Finish time: ____________  Date: ____________ Movements: ____________ Start time: ____________ Finish time: ____________ Date: ____________ Movements: ____________ Start time: ____________ Finish time: ____________ Date: ____________ Movements: ____________ Start time: ____________ Finish time: ____________ Date: ____________ Movements: ____________ Start time: ____________ Finish time: ____________ Date: ____________ Movements: ____________ Start time: ____________ Finish time: ____________ Date: ____________ Movements: ____________ Start time: ____________ Finish time: ____________ Document Released: 12/29/2006 Document Revised: 11/18/2011 Document Reviewed: 07/01/2009 [employer] Patient Information 2012 [employer]. 

## 2012-05-16 NOTE — Progress Notes (Signed)
Pt without c/o FKC reviewed GBS@[place] 

## 2012-05-19 ENCOUNTER — Other Ambulatory Visit: Payer: Self-pay

## 2012-05-22 ENCOUNTER — Encounter: Payer: Self-pay | Admitting: Obstetrics and Gynecology

## 2012-05-22 ENCOUNTER — Other Ambulatory Visit: Payer: Self-pay | Admitting: Obstetrics and Gynecology

## 2012-05-22 ENCOUNTER — Ambulatory Visit (INDEPENDENT_AMBULATORY_CARE_PROVIDER_SITE_OTHER): Payer: Medicaid Other | Admitting: Obstetrics and Gynecology

## 2012-05-22 ENCOUNTER — Ambulatory Visit (INDEPENDENT_AMBULATORY_CARE_PROVIDER_SITE_OTHER): Payer: Medicaid Other

## 2012-05-22 VITALS — BP 110/58 | Wt 193.0 lb

## 2012-05-22 DIAGNOSIS — O99810 Abnormal glucose complicating pregnancy: Secondary | ICD-10-CM

## 2012-05-22 DIAGNOSIS — Z3490 Encounter for supervision of normal pregnancy, unspecified, unspecified trimester: Secondary | ICD-10-CM

## 2012-05-22 DIAGNOSIS — Z331 Pregnant state, incidental: Secondary | ICD-10-CM

## 2012-05-22 DIAGNOSIS — O3680X0 Pregnancy with inconclusive fetal viability, not applicable or unspecified: Secondary | ICD-10-CM

## 2012-05-22 DIAGNOSIS — Z8619 Personal history of other infectious and parasitic diseases: Secondary | ICD-10-CM

## 2012-05-22 NOTE — Progress Notes (Signed)
No complaints. U/S for s>d :  EFW: 5lb 12 oz 69th percentile   34 w 6 days   cX 3.75CM   AFI 16.5 GBS, GC, CHLAMYDIA,URINE C&S TOC,nv  Checklist reviewed

## 2012-06-05 ENCOUNTER — Ambulatory Visit (INDEPENDENT_AMBULATORY_CARE_PROVIDER_SITE_OTHER): Payer: Medicaid Other | Admitting: Obstetrics and Gynecology

## 2012-06-05 VITALS — BP 102/60 | Wt 188.0 lb

## 2012-06-05 DIAGNOSIS — Z331 Pregnant state, incidental: Secondary | ICD-10-CM

## 2012-06-05 LAB — POCT URINALYSIS DIPSTICK
Bilirubin, UA: 1
Glucose, UA: NEGATIVE
Ketones, UA: NEGATIVE
Spec Grav, UA: 1.015
Urobilinogen, UA: NEGATIVE

## 2012-06-05 NOTE — Progress Notes (Signed)
Gc/Chlamydia negative 05/15/12 GBS today Labor signs reviewed

## 2012-06-05 NOTE — Progress Notes (Signed)
Pt. Stated no issues today . TOC for urin culture from last visit.

## 2012-06-07 LAB — STREP B DNA PROBE: GBSP: NEGATIVE

## 2012-06-12 ENCOUNTER — Encounter: Payer: Self-pay | Admitting: Obstetrics and Gynecology

## 2012-06-12 ENCOUNTER — Ambulatory Visit (INDEPENDENT_AMBULATORY_CARE_PROVIDER_SITE_OTHER): Payer: Medicaid Other | Admitting: Obstetrics and Gynecology

## 2012-06-12 VITALS — BP 116/72 | Wt 188.0 lb

## 2012-06-12 DIAGNOSIS — O26849 Uterine size-date discrepancy, unspecified trimester: Secondary | ICD-10-CM

## 2012-06-12 DIAGNOSIS — Z331 Pregnant state, incidental: Secondary | ICD-10-CM

## 2012-06-12 NOTE — Patient Instructions (Signed)

## 2012-06-12 NOTE — Progress Notes (Signed)
Pt request note for work to start leave.

## 2012-06-12 NOTE — Progress Notes (Signed)
[redacted]w[redacted]d GBS neg S>D, will repeat growth [place] Note printed stating that pt requests to be out of work No c/o,  Finishing CBE this week rv'd FKC and labor sx's

## 2012-06-14 LAB — US OB FOLLOW UP

## 2012-06-21 ENCOUNTER — Ambulatory Visit (INDEPENDENT_AMBULATORY_CARE_PROVIDER_SITE_OTHER): Payer: Medicaid Other | Admitting: Obstetrics and Gynecology

## 2012-06-21 ENCOUNTER — Encounter: Payer: Self-pay | Admitting: Obstetrics and Gynecology

## 2012-06-21 ENCOUNTER — Ambulatory Visit (INDEPENDENT_AMBULATORY_CARE_PROVIDER_SITE_OTHER): Payer: Medicaid Other

## 2012-06-21 VITALS — BP 120/60 | Wt 191.0 lb

## 2012-06-21 DIAGNOSIS — O26849 Uterine size-date discrepancy, unspecified trimester: Secondary | ICD-10-CM

## 2012-06-21 DIAGNOSIS — R0602 Shortness of breath: Secondary | ICD-10-CM

## 2012-06-21 DIAGNOSIS — Z331 Pregnant state, incidental: Secondary | ICD-10-CM

## 2012-06-21 LAB — US OB FOLLOW UP

## 2012-06-21 NOTE — Patient Instructions (Signed)
Patient needs letter to start maternity leave now

## 2012-06-21 NOTE — Progress Notes (Signed)
Pt. Stated sometimes having trouble breathing. Pt wants a cervix check today .

## 2012-06-21 NOTE — Progress Notes (Signed)
Good FA. Reports contractions are stronger.No LOF  No bleeding Ultrasound: EFW  8 lbs 9 oz   >90%           AFI  normal

## 2012-06-29 ENCOUNTER — Other Ambulatory Visit: Payer: Self-pay | Admitting: Obstetrics and Gynecology

## 2012-06-29 ENCOUNTER — Ambulatory Visit (INDEPENDENT_AMBULATORY_CARE_PROVIDER_SITE_OTHER): Payer: Medicaid Other

## 2012-06-29 ENCOUNTER — Ambulatory Visit (INDEPENDENT_AMBULATORY_CARE_PROVIDER_SITE_OTHER): Payer: Medicaid Other | Admitting: Obstetrics and Gynecology

## 2012-06-29 VITALS — BP 110/60 | Wt 193.0 lb

## 2012-06-29 DIAGNOSIS — O368390 Maternal care for abnormalities of the fetal heart rate or rhythm, unspecified trimester, not applicable or unspecified: Secondary | ICD-10-CM

## 2012-06-29 DIAGNOSIS — O26849 Uterine size-date discrepancy, unspecified trimester: Secondary | ICD-10-CM

## 2012-06-29 DIAGNOSIS — IMO0002 Reserved for concepts with insufficient information to code with codable children: Secondary | ICD-10-CM

## 2012-06-29 NOTE — Progress Notes (Signed)
pt stated no issues today/ pt wants a cervix check today .

## 2012-06-29 NOTE — Progress Notes (Signed)
Nonstress test today because of fetal tachycardia. Return office in one week. Dr. Simonne Come test: Baseline 160-170 bpm, accelerations noted greater than 15 bpm for greater than 15 seconds.  Variability 5 bpm. Ultrasound: Biophysical profile 8 out of 8, single gestation, vertex, normal fluid, fetal heart rate 169 bpm. Return to office in 1 week. Dr. Stefano Gaul

## 2012-06-30 ENCOUNTER — Other Ambulatory Visit: Payer: Medicaid Other

## 2012-07-03 ENCOUNTER — Encounter (HOSPITAL_COMMUNITY): Payer: Self-pay

## 2012-07-03 ENCOUNTER — Ambulatory Visit (INDEPENDENT_AMBULATORY_CARE_PROVIDER_SITE_OTHER): Payer: Medicaid Other | Admitting: Obstetrics and Gynecology

## 2012-07-03 ENCOUNTER — Ambulatory Visit (INDEPENDENT_AMBULATORY_CARE_PROVIDER_SITE_OTHER): Payer: Medicaid Other

## 2012-07-03 ENCOUNTER — Ambulatory Visit: Payer: Medicaid Other

## 2012-07-03 ENCOUNTER — Encounter: Payer: Self-pay | Admitting: Obstetrics and Gynecology

## 2012-07-03 ENCOUNTER — Inpatient Hospital Stay (HOSPITAL_COMMUNITY)
Admission: AD | Admit: 2012-07-03 | Discharge: 2012-07-03 | Disposition: A | Discharge status: Home / Self Care | Payer: Medicaid Other | Source: Ambulatory Visit | Attending: Obstetrics and Gynecology | Admitting: Obstetrics and Gynecology

## 2012-07-03 VITALS — BP 122/62

## 2012-07-03 DIAGNOSIS — O289 Unspecified abnormal findings on antenatal screening of mother: Secondary | ICD-10-CM

## 2012-07-03 DIAGNOSIS — O288 Other abnormal findings on antenatal screening of mother: Secondary | ICD-10-CM

## 2012-07-03 DIAGNOSIS — N939 Abnormal uterine and vaginal bleeding, unspecified: Secondary | ICD-10-CM

## 2012-07-03 DIAGNOSIS — Z331 Pregnant state, incidental: Secondary | ICD-10-CM

## 2012-07-03 DIAGNOSIS — O4690 Antepartum hemorrhage, unspecified, unspecified trimester: Secondary | ICD-10-CM | POA: Insufficient documentation

## 2012-07-03 DIAGNOSIS — F329 Major depressive disorder, single episode, unspecified: Secondary | ICD-10-CM

## 2012-07-03 DIAGNOSIS — R58 Hemorrhage, not elsewhere classified: Secondary | ICD-10-CM

## 2012-07-03 DIAGNOSIS — R011 Cardiac murmur, unspecified: Secondary | ICD-10-CM

## 2012-07-03 DIAGNOSIS — Z3490 Encounter for supervision of normal pregnancy, unspecified, unspecified trimester: Secondary | ICD-10-CM

## 2012-07-03 DIAGNOSIS — D649 Anemia, unspecified: Secondary | ICD-10-CM

## 2012-07-03 DIAGNOSIS — O26859 Spotting complicating pregnancy, unspecified trimester: Secondary | ICD-10-CM

## 2012-07-03 DIAGNOSIS — Z8619 Personal history of other infectious and parasitic diseases: Secondary | ICD-10-CM

## 2012-07-03 DIAGNOSIS — O468X9 Other antepartum hemorrhage, unspecified trimester: Secondary | ICD-10-CM

## 2012-07-03 NOTE — Progress Notes (Unsigned)
No complaints. CX check.

## 2012-07-03 NOTE — Progress Notes (Signed)
BPP 8/8, vtx, normal fluid After exam pt report flow of bright red blood Spec exam performed - area could not be localized but there was active bright red flow from cervix Will have pt go to [facility] for further obs and eval and decision on induction.  If d/c'd from hosp will plan to schedule induction on Thurs evening. Questions answered

## 2012-07-03 NOTE — Progress Notes (Signed)
nst non-reactive per AR.

## 2012-07-03 NOTE — [facility] Note (Signed)
Patient is in with c/o vaginal bleeding, reports good fetal movement. Minimal pain.

## 2012-07-03 NOTE — Progress Notes (Signed)
History    Rachel [name]  21 y.o. [redacted]w[redacted]d  C/o of vag bleeding, denies abdominal pain, uc, denies srom,  with +FM  Chief Complaint  Patient presents with  . Vaginal Bleeding   @SFHPI @  OB History    Grav Para Term Preterm Abortions TAB SAB Ect Mult Living   2    1  1          Past Medical History  Diagnosis Date  . Miscarriage   . Chlamydia   . Gonorrhea   . Depression 2011    NO COUNSELING OR MEDS  . Infection 2009    CHLAMYDIA  . Infection 2009    GC  . Infection     YEAST X1  . Anemia 2012  . Heart murmur     SINCE BIRTH    Past Surgical History  Procedure Date  . Wisdom tooth extraction     AGE 47    Family History  Problem Relation Age of Onset  . Hypertension Mother   . Miscarriages / [place] Mother   . Hypertension Father   . Diabetes Father   . Heart disease Father   . Kidney disease Father     MASS; KIDNEY REMOVED  . Stroke Father   . Anesthesia problems Neg Hx   . Hypotension Neg Hx   . Malignant hyperthermia Neg Hx   . Pseudochol deficiency Neg Hx   . Asthma Sister   . Cancer Sister     STOMACH  . Asthma Brother   . Cancer Paternal Grandmother     STOMACH  . Lupus Sister   . Lupus Cousin     History  Substance Use Topics  . Smoking status: Former Smoker -- 0.2 packs/day    Quit date: 11/02/2011  . Smokeless tobacco: Never Used  . Alcohol Use: No    Allergies: No Known Allergies  Prescriptions prior to admission  Medication Sig Dispense Refill  . calcium carbonate (TUMS) 500 MG chewable tablet Chew 1 tablet (200 mg of elemental calcium total) by mouth 3 (three) times daily.  30 tablet  0  . Prenatal Vit-Fe Fumarate-FA (PRENATAL MULTIVITAMIN) TABS Take 1 tablet by mouth daily.      . ranitidine (ZANTAC) 150 MG tablet Take 1 tablet (150 mg total) by mouth 2 (two) times daily.  60 tablet  0   Temp:  [98.9 F (37.2 C)] 98.9 F (37.2 C) (07/22 1355) Pulse Rate:  [76] 76  (07/22 1355) Resp:  [18] 18  (07/22  1355) BP: (122-142)/(60-68) 133/68 mmHg (07/22 1355) Weight:  [149 lb 2 oz (67.643 kg)-196 lb (88.905 kg)] 149 lb 2 oz (67.643 kg) (07/22 1344) @ROS @ Physical Exam   Blood pressure 133/68, pulse 76, temperature 98.9 F (37.2 C), temperature source Oral, resp. rate 18, height 5\' 3"  (1.6 m), weight 149 lb 2 oz (67.643 kg), last menstrual period 09/24/2011. Calm, quiet no distress abd soft, gravid, nt Fhts present uc denies Normal hair distrubition mons pubis,  EGBUS WNL, sterile speculum exam,  vagina pink, moist normal rugae,  cerix appears LTC, no cervical motion tenderness, Scant red blood noted no active bleeding with SSE A [redacted]w[redacted]d GBS neg    Vag bleeding after office exam resolved P EFM now. Anticipate discharge. Reviewed s/s uc, srom, vag bleeding, daily fetal kick counts to report, comfort measures. Encouragged 8 water daily and frequent voids. Lavera Guise, CNM  Addendum: fhts reactive category 1 150s  uc irregular to q 4 mild Lavera Guise, CNM

## 2012-07-04 ENCOUNTER — Telehealth: Payer: Self-pay | Admitting: Obstetrics and Gynecology

## 2012-07-04 NOTE — Telephone Encounter (Signed)
Induction scheduled for 07/08/12 @ 7:30 pm with Nona/Stringer.  Rachel [name]

## 2012-07-05 ENCOUNTER — Encounter (HOSPITAL_COMMUNITY): Payer: Self-pay | Admitting: *Deleted

## 2012-07-05 ENCOUNTER — Telehealth (HOSPITAL_COMMUNITY): Payer: Self-pay | Admitting: *Deleted

## 2012-07-05 NOTE — Telephone Encounter (Signed)
Preadmission screen  

## 2012-07-06 ENCOUNTER — Encounter (HOSPITAL_COMMUNITY): Payer: Self-pay

## 2012-07-06 ENCOUNTER — Inpatient Hospital Stay (HOSPITAL_COMMUNITY)
Admission: RE | Admit: 2012-07-06 | Discharge: 2012-07-10 | DRG: 766 | Disposition: A | Discharge status: Home / Self Care | Payer: Medicaid Other | Source: Ambulatory Visit | Attending: Obstetrics and Gynecology | Admitting: Obstetrics and Gynecology

## 2012-07-06 ENCOUNTER — Other Ambulatory Visit: Payer: Self-pay | Admitting: Obstetrics and Gynecology

## 2012-07-06 DIAGNOSIS — O9081 Anemia of the puerperium: Secondary | ICD-10-CM | POA: Diagnosis not present

## 2012-07-06 DIAGNOSIS — Z98891 History of uterine scar from previous surgery: Secondary | ICD-10-CM | POA: Diagnosis not present

## 2012-07-06 DIAGNOSIS — O9903 Anemia complicating the puerperium: Secondary | ICD-10-CM | POA: Diagnosis not present

## 2012-07-06 DIAGNOSIS — O480 Post-term pregnancy: Principal | ICD-10-CM | POA: Diagnosis present

## 2012-07-06 DIAGNOSIS — D649 Anemia, unspecified: Secondary | ICD-10-CM | POA: Diagnosis not present

## 2012-07-06 HISTORY — DX: Anemia of the puerperium: O90.81

## 2012-07-06 HISTORY — DX: Encounter for surveillance of injectable contraceptive: Z30.42

## 2012-07-06 LAB — CBC
HCT: 38.1 % (ref 36.0–46.0)
MCH: 26.5 pg (ref 26.0–34.0)
MCV: 83.6 fL (ref 78.0–100.0)
Platelets: 184 10*3/uL (ref 150–400)
RBC: 4.56 MIL/uL (ref 3.87–5.11)
RDW: 15.2 % (ref 11.5–15.5)

## 2012-07-06 MED ORDER — NALBUPHINE SYRINGE 5 MG/0.5 ML: 5.0000 mg | INJECTION | INTRAMUSCULAR | Status: DC | PRN

## 2012-07-06 MED ORDER — LACTATED RINGERS IV SOLN
INTRAVENOUS | Status: DC
  Administered 2012-07-06 – 2012-07-07 (×4): via INTRAVENOUS

## 2012-07-06 MED ORDER — MISOPROSTOL 25 MCG QUARTER TABLET
25.0000 ug | ORAL_TABLET | ORAL | Status: DC | PRN
  Administered 2012-07-06: 25 ug via VAGINAL
  Filled 2012-07-06: qty 0.25

## 2012-07-06 MED ORDER — PROMETHAZINE HCL 25 MG/ML IJ SOLN: 12.5000 mg | INTRAMUSCULAR | Status: DC | PRN

## 2012-07-06 MED ORDER — CITRIC ACID-SODIUM CITRATE 334-500 MG/5ML PO SOLN
30.0000 mL | ORAL | Status: DC | PRN
  Administered 2012-07-07: 30 mL via ORAL
  Filled 2012-07-06: qty 15

## 2012-07-06 MED ORDER — IBUPROFEN 600 MG PO TABS: 600.0000 mg | ORAL_TABLET | Freq: Four times a day (QID) | ORAL | Status: DC | PRN

## 2012-07-06 MED ORDER — ACETAMINOPHEN 325 MG PO TABS: 650.0000 mg | ORAL_TABLET | ORAL | Status: DC | PRN

## 2012-07-06 MED ORDER — FLEET ENEMA 7-19 GM/118ML RE ENEM: 1.0000 | ENEMA | RECTAL | Status: DC | PRN

## 2012-07-06 MED ORDER — LIDOCAINE HCL (PF) 1 % IJ SOLN: 30.0000 mL | INTRAMUSCULAR | Status: DC | PRN

## 2012-07-06 MED ORDER — ONDANSETRON HCL 4 MG/2ML IJ SOLN: 4.0000 mg | Freq: Four times a day (QID) | INTRAMUSCULAR | Status: DC | PRN

## 2012-07-06 MED ORDER — OXYCODONE-ACETAMINOPHEN 5-325 MG PO TABS: 1.0000 | ORAL_TABLET | ORAL | Status: DC | PRN

## 2012-07-06 MED ORDER — OXYTOCIN 40 UNITS IN LACTATED RINGERS INFUSION - SIMPLE MED: 62.5000 mL/h | Freq: Once | INTRAVENOUS | Status: DC

## 2012-07-06 MED ORDER — TERBUTALINE SULFATE 1 MG/ML IJ SOLN: 0.2500 mg | Freq: Once | INTRAMUSCULAR | Status: AC | PRN

## 2012-07-06 MED ORDER — LACTATED RINGERS IV SOLN
500.0000 mL | INTRAVENOUS | Status: DC | PRN
  Administered 2012-07-06: 300 mL via INTRAVENOUS
  Administered 2012-07-06: 200 mL via INTRAVENOUS
  Administered 2012-07-07: 300 mL via INTRAVENOUS

## 2012-07-06 MED ORDER — ZOLPIDEM TARTRATE 5 MG PO TABS: 5.0000 mg | ORAL_TABLET | Freq: Every evening | ORAL | Status: DC | PRN

## 2012-07-06 MED ORDER — OXYTOCIN BOLUS FROM INFUSION
250.0000 mL | Freq: Once | INTRAVENOUS | Status: DC
  Filled 2012-07-06: qty 500

## 2012-07-06 NOTE — H&P (Signed)
Rachel [name] is a 21 y.o. female presenting for IOL secondary to PD. Pt is [redacted]w[redacted]d. G2P0. She denies any c/o, no ctx, VB, LOF, +FM.   HPI: Pt rcv'd PNC at [facility], w tx of care at 33wks. PT was seen in MAU at 19wks for RLP and YI, Also seen in MAU at 27wks and tx'd for GERD. She was also tx'd for BV and a presumptive UTI at 33wks, UA cx was neg. GC/CT were neg, and GBS was neg at 36wks. Growth [place] at [redacted]w[redacted]d =EFW 8-9, 90% and nl AFI. She had a BPP and NST today, that were normal.    Maternal Medical History:  Reason for admission: IOL for PD  Contractions: denies  Fetal activity: Perceived fetal activity is normal.   Last perceived fetal movement was within the past hour.    Prenatal complications: no prenatal complications   OB History    Grav Para Term Preterm Abortions TAB SAB Ect Mult Living   2    1  1         Past Medical History  Diagnosis Date  . Miscarriage   . Chlamydia   . Gonorrhea   . Depression 2011    NO COUNSELING OR MEDS  . Infection 2009    CHLAMYDIA  . Infection 2009    GC  . Infection     YEAST X1  . Anemia 2012  . Heart murmur     SINCE BIRTH   Past Surgical History  Procedure Date  . Wisdom tooth extraction     AGE 33   Family History: family history includes Asthma in her brother and sister; Cancer in her paternal grandmother; Diabetes in her father; Heart disease in her father; Hypertension in her father and mother; Kidney disease in her father; Lupus in her cousin and sister; Miscarriages / Stillbirths in her mother; and Stroke in her father.  There is no history of Anesthesia problems, and Hypotension, and Malignant hyperthermia, and Pseudochol deficiency, . Social History:  reports that she quit smoking about 8 months ago. She has never used smokeless tobacco. She reports that she does not drink alcohol or use illicit drugs.   Prenatal Transfer Tool  Maternal Diabetes: No Genetic Screening: Normal Maternal Ultrasounds/Referrals:  Normal Fetal Ultrasounds or other Referrals:  None Maternal Substance Abuse:  No Significant Maternal Medications:  None Significant Maternal Lab Results:  None Other Comments:  None  Review of Systems  All other systems reviewed and are negative.    Dilation: Fingertip Effacement (%): Thick Station: -2 Exam by:: Lee Kalt Blood pressure 141/79, pulse 89, temperature 99.4 F (37.4 C), temperature source Oral, resp. rate 20, height 5\' 3"  (1.6 m), weight 196 lb (88.905 kg), last menstrual period 09/24/2011, unknown if currently breastfeeding. Maternal Exam:  Uterine Assessment: Contraction strength is mild.  Contraction duration is 50 seconds. Contraction frequency is irregular.   Abdomen: Patient reports no abdominal tenderness. Fundal height is AGA.   Estimated fetal weight is 8-9.   Fetal presentation: vertex  Introitus: Normal vulva. Normal vagina.  Ferning test: not done.   Pelvis: adequate for delivery.   Cervix: Cervix evaluated by digital exam.     Fetal Exam Fetal Monitor Review: Mode: ultrasound.   Baseline rate: 160.  Variability: moderate (6-25 bpm).   Pattern: accelerations present and no decelerations.    Fetal State Assessment: Category I - tracings are normal.     Physical Exam  Nursing note and vitals reviewed. Constitutional: She is oriented to person,  place, and time. She appears well-developed and well-nourished. No distress.  HENT:  Head: Normocephalic.  Neck: Normal range of motion.  Cardiovascular: Normal rate, regular rhythm and normal heart sounds.   Respiratory: Effort normal and breath sounds normal.  GI: Soft. Bowel sounds are normal.  Genitourinary: Vagina normal.  Musculoskeletal: Normal range of motion. She exhibits no edema.  Neurological: She is alert and oriented to person, place, and time.  Skin: Skin is warm and dry.  Psychiatric: She has a normal mood and affect. Her behavior is normal.    Prenatal labs: ABO, Rh: --/--/A POS  (11/27 1310) Antibody: Negative (01/16 0000) Rubella: Immune (01/16 0000) RPR: Nonreactive (04/24 0000)  HBsAg: Negative (01/16 0000)  HIV: Non-reactive (04/24 0000)  GBS: NEGATIVE (06/24 1525)  GC/CT neg  Assessment/Plan: IUP at [redacted]w[redacted]d GBS neg FHR reassuring  Unfavorable cx  Admit to b.s. Per c/w Dr Su Hilt Routine L&D orders cytotec PV overnight Pitocin when appropriate Analgesia/anesthesia PRN, pt plans epidural    [name] M 07/06/2012, 10:26 PM

## 2012-07-07 ENCOUNTER — Encounter (HOSPITAL_COMMUNITY): Payer: Self-pay | Admitting: Anesthesiology

## 2012-07-07 ENCOUNTER — Inpatient Hospital Stay (HOSPITAL_COMMUNITY): Payer: Medicaid Other | Admitting: Anesthesiology

## 2012-07-07 ENCOUNTER — Encounter (HOSPITAL_COMMUNITY): Payer: Self-pay

## 2012-07-07 ENCOUNTER — Encounter (HOSPITAL_COMMUNITY)
Admission: RE | Disposition: A | Discharge status: Home / Self Care | Payer: Self-pay | Source: Ambulatory Visit | Attending: Obstetrics and Gynecology

## 2012-07-07 DIAGNOSIS — Z98891 History of uterine scar from previous surgery: Secondary | ICD-10-CM | POA: Diagnosis not present

## 2012-07-07 DIAGNOSIS — Z331 Pregnant state, incidental: Secondary | ICD-10-CM

## 2012-07-07 DIAGNOSIS — O480 Post-term pregnancy: Secondary | ICD-10-CM

## 2012-07-07 LAB — CBC
MCH: 27.4 pg (ref 26.0–34.0)
MCHC: 32.7 g/dL (ref 30.0–36.0)
MCV: 83.8 fL (ref 78.0–100.0)
Platelets: 153 10*3/uL (ref 150–400)

## 2012-07-07 SURGERY — Surgical Case
Anesthesia: Spinal | Site: Abdomen | Wound class: Clean Contaminated

## 2012-07-07 MED ORDER — OXYTOCIN 40 UNITS IN LACTATED RINGERS INFUSION - SIMPLE MED: 62.5000 mL/h | INTRAVENOUS | Status: AC

## 2012-07-07 MED ORDER — OXYTOCIN 10 UNIT/ML IJ SOLN
INTRAMUSCULAR | Status: AC
  Filled 2012-07-07: qty 4

## 2012-07-07 MED ORDER — KETOROLAC TROMETHAMINE 30 MG/ML IJ SOLN: 30.0000 mg | Freq: Four times a day (QID) | INTRAMUSCULAR | Status: AC | PRN

## 2012-07-07 MED ORDER — BUPIVACAINE IN DEXTROSE 0.75-8.25 % IT SOLN
INTRATHECAL | Status: DC | PRN
  Administered 2012-07-07: 13 mg via INTRATHECAL

## 2012-07-07 MED ORDER — IBUPROFEN 600 MG PO TABS
600.0000 mg | ORAL_TABLET | Freq: Four times a day (QID) | ORAL | Status: DC
  Administered 2012-07-07 – 2012-07-10 (×13): 600 mg via ORAL
  Filled 2012-07-07 (×13): qty 1

## 2012-07-07 MED ORDER — ONDANSETRON HCL 4 MG/2ML IJ SOLN
INTRAMUSCULAR | Status: AC
  Filled 2012-07-07: qty 2

## 2012-07-07 MED ORDER — OXYTOCIN 40 UNITS IN LACTATED RINGERS INFUSION - SIMPLE MED
INTRAVENOUS | Status: AC
  Filled 2012-07-07: qty 1000

## 2012-07-07 MED ORDER — PHENYLEPHRINE 40 MCG/ML (10ML) SYRINGE FOR IV PUSH (FOR BLOOD PRESSURE SUPPORT)
PREFILLED_SYRINGE | INTRAVENOUS | Status: AC
Start: 2012-07-07 — End: 2012-07-07
  Filled 2012-07-07: qty 5

## 2012-07-07 MED ORDER — SODIUM CHLORIDE 0.9 % IJ SOLN: 3.0000 mL | INTRAMUSCULAR | Status: DC | PRN

## 2012-07-07 MED ORDER — CEFAZOLIN SODIUM-DEXTROSE 2-3 GM-% IV SOLR
INTRAVENOUS | Status: AC
  Filled 2012-07-07: qty 50

## 2012-07-07 MED ORDER — NALBUPHINE SYRINGE 5 MG/0.5 ML
5.0000 mg | INJECTION | INTRAMUSCULAR | Status: DC | PRN
  Administered 2012-07-07: 10 mg via SUBCUTANEOUS
  Filled 2012-07-07: qty 1

## 2012-07-07 MED ORDER — MORPHINE SULFATE 0.5 MG/ML IJ SOLN
INTRAMUSCULAR | Status: AC
  Filled 2012-07-07: qty 10

## 2012-07-07 MED ORDER — SIMETHICONE 80 MG PO CHEW: 80.0000 mg | CHEWABLE_TABLET | ORAL | Status: DC | PRN

## 2012-07-07 MED ORDER — DIPHENHYDRAMINE HCL 50 MG/ML IJ SOLN: 12.5000 mg | INTRAMUSCULAR | Status: DC | PRN

## 2012-07-07 MED ORDER — DIBUCAINE 1 % RE OINT: 1.0000 "application " | TOPICAL_OINTMENT | RECTAL | Status: DC | PRN

## 2012-07-07 MED ORDER — MENTHOL 3 MG MT LOZG: 1.0000 | LOZENGE | OROMUCOSAL | Status: DC | PRN

## 2012-07-07 MED ORDER — ONDANSETRON HCL 4 MG/2ML IJ SOLN: 4.0000 mg | Freq: Three times a day (TID) | INTRAMUSCULAR | Status: DC | PRN

## 2012-07-07 MED ORDER — OXYTOCIN 10 UNIT/ML IJ SOLN
40.0000 [IU] | INTRAVENOUS | Status: DC | PRN
  Administered 2012-07-07: 40 [IU] via INTRAVENOUS

## 2012-07-07 MED ORDER — MEPERIDINE HCL 25 MG/ML IJ SOLN: 6.2500 mg | INTRAMUSCULAR | Status: DC | PRN

## 2012-07-07 MED ORDER — WITCH HAZEL-GLYCERIN EX PADS: 1.0000 "application " | MEDICATED_PAD | CUTANEOUS | Status: DC | PRN

## 2012-07-07 MED ORDER — CEFAZOLIN SODIUM 1-5 GM-% IV SOLN
INTRAVENOUS | Status: DC | PRN
  Administered 2012-07-07: 2 g via INTRAVENOUS

## 2012-07-07 MED ORDER — KETOROLAC TROMETHAMINE 60 MG/2ML IM SOLN
60.0000 mg | Freq: Once | INTRAMUSCULAR | Status: AC | PRN
  Administered 2012-07-07: 60 mg via INTRAMUSCULAR

## 2012-07-07 MED ORDER — FENTANYL CITRATE 0.05 MG/ML IJ SOLN
INTRAMUSCULAR | Status: AC
  Filled 2012-07-07: qty 2

## 2012-07-07 MED ORDER — KETOROLAC TROMETHAMINE 60 MG/2ML IM SOLN
INTRAMUSCULAR | Status: AC
  Administered 2012-07-07: 60 mg via INTRAMUSCULAR
  Filled 2012-07-07: qty 2

## 2012-07-07 MED ORDER — FENTANYL CITRATE 0.05 MG/ML IJ SOLN
INTRAMUSCULAR | Status: DC | PRN
  Administered 2012-07-07: 25 ug via INTRATHECAL

## 2012-07-07 MED ORDER — SCOPOLAMINE 1 MG/3DAYS TD PT72
1.0000 | MEDICATED_PATCH | Freq: Once | TRANSDERMAL | Status: DC
  Administered 2012-07-07: 1.5 mg via TRANSDERMAL

## 2012-07-07 MED ORDER — DIPHENHYDRAMINE HCL 25 MG PO CAPS
25.0000 mg | ORAL_CAPSULE | ORAL | Status: DC | PRN
  Administered 2012-07-07: 25 mg via ORAL
  Filled 2012-07-07: qty 1

## 2012-07-07 MED ORDER — LACTATED RINGERS IV SOLN: INTRAVENOUS | Status: DC

## 2012-07-07 MED ORDER — MISOPROSTOL 200 MCG PO TABS
ORAL_TABLET | ORAL | Status: AC
  Administered 2012-07-07: 800 ug via RECTAL
  Filled 2012-07-07: qty 4

## 2012-07-07 MED ORDER — SCOPOLAMINE 1 MG/3DAYS TD PT72
MEDICATED_PATCH | TRANSDERMAL | Status: AC
  Administered 2012-07-07: 1.5 mg via TRANSDERMAL
  Filled 2012-07-07: qty 1

## 2012-07-07 MED ORDER — PHENYLEPHRINE HCL 10 MG/ML IJ SOLN
INTRAMUSCULAR | Status: DC | PRN
  Administered 2012-07-07: 40 ug via INTRAVENOUS
  Administered 2012-07-07 (×3): 80 ug via INTRAVENOUS
  Administered 2012-07-07: 40 ug via INTRAVENOUS

## 2012-07-07 MED ORDER — METOCLOPRAMIDE HCL 5 MG/ML IJ SOLN: 10.0000 mg | Freq: Three times a day (TID) | INTRAMUSCULAR | Status: DC | PRN

## 2012-07-07 MED ORDER — LANOLIN HYDROUS EX OINT: 1.0000 "application " | TOPICAL_OINTMENT | CUTANEOUS | Status: DC | PRN

## 2012-07-07 MED ORDER — OXYTOCIN 40 UNITS IN LACTATED RINGERS INFUSION - SIMPLE MED
62.5000 mL/h | Freq: Once | INTRAVENOUS | Status: AC
  Administered 2012-07-07: 62.5 mL/h via INTRAVENOUS

## 2012-07-07 MED ORDER — DIPHENHYDRAMINE HCL 50 MG/ML IJ SOLN: 25.0000 mg | INTRAMUSCULAR | Status: DC | PRN

## 2012-07-07 MED ORDER — ZOLPIDEM TARTRATE 5 MG PO TABS: 5.0000 mg | ORAL_TABLET | Freq: Every evening | ORAL | Status: DC | PRN

## 2012-07-07 MED ORDER — SODIUM CHLORIDE 0.9 % IV SOLN
1.0000 ug/kg/h | INTRAVENOUS | Status: DC | PRN
  Filled 2012-07-07: qty 2.5

## 2012-07-07 MED ORDER — SIMETHICONE 80 MG PO CHEW
80.0000 mg | CHEWABLE_TABLET | Freq: Three times a day (TID) | ORAL | Status: DC
  Administered 2012-07-07 – 2012-07-09 (×9): 80 mg via ORAL

## 2012-07-07 MED ORDER — MORPHINE SULFATE (PF) 0.5 MG/ML IJ SOLN
INTRAMUSCULAR | Status: DC | PRN
  Administered 2012-07-07: .15 mg via INTRATHECAL

## 2012-07-07 MED ORDER — TERBUTALINE SULFATE 1 MG/ML IJ SOLN
0.2500 mg | Freq: Once | INTRAMUSCULAR | Status: AC | PRN
  Administered 2012-07-07: 0.25 mg via SUBCUTANEOUS
  Filled 2012-07-07: qty 1

## 2012-07-07 MED ORDER — ONDANSETRON HCL 4 MG/2ML IJ SOLN: 4.0000 mg | INTRAMUSCULAR | Status: DC | PRN

## 2012-07-07 MED ORDER — PRENATAL MULTIVITAMIN CH
1.0000 | ORAL_TABLET | Freq: Every day | ORAL | Status: DC
Start: 2012-07-07 — End: 2012-07-10
  Administered 2012-07-07 – 2012-07-10 (×4): 1 via ORAL
  Filled 2012-07-07 (×4): qty 1

## 2012-07-07 MED ORDER — OXYTOCIN 40 UNITS IN LACTATED RINGERS INFUSION - SIMPLE MED: 1.0000 m[IU]/min | INTRAVENOUS | Status: DC

## 2012-07-07 MED ORDER — ONDANSETRON HCL 4 MG/2ML IJ SOLN
INTRAMUSCULAR | Status: DC | PRN
  Administered 2012-07-07: 4 mg via INTRAVENOUS

## 2012-07-07 MED ORDER — NALOXONE HCL 0.4 MG/ML IJ SOLN: 0.4000 mg | INTRAMUSCULAR | Status: DC | PRN

## 2012-07-07 MED ORDER — IBUPROFEN 600 MG PO TABS: 600.0000 mg | ORAL_TABLET | Freq: Four times a day (QID) | ORAL | Status: DC | PRN

## 2012-07-07 MED ORDER — OXYCODONE-ACETAMINOPHEN 5-325 MG PO TABS
1.0000 | ORAL_TABLET | ORAL | Status: DC | PRN
  Administered 2012-07-07: 1 via ORAL
  Administered 2012-07-08: 2 via ORAL
  Administered 2012-07-08: 1 via ORAL
  Administered 2012-07-09 (×2): 2 via ORAL
  Administered 2012-07-10: 1 via ORAL
  Filled 2012-07-07 (×3): qty 1
  Filled 2012-07-07 (×3): qty 2

## 2012-07-07 MED ORDER — ONDANSETRON HCL 4 MG PO TABS: 4.0000 mg | ORAL_TABLET | ORAL | Status: DC | PRN

## 2012-07-07 MED ORDER — FENTANYL CITRATE 0.05 MG/ML IJ SOLN
25.0000 ug | INTRAMUSCULAR | Status: DC | PRN
  Administered 2012-07-07: 50 ug via INTRAVENOUS

## 2012-07-07 MED ORDER — DIPHENHYDRAMINE HCL 25 MG PO CAPS: 25.0000 mg | ORAL_CAPSULE | Freq: Four times a day (QID) | ORAL | Status: DC | PRN

## 2012-07-07 MED ORDER — SENNOSIDES-DOCUSATE SODIUM 8.6-50 MG PO TABS
2.0000 | ORAL_TABLET | Freq: Every day | ORAL | Status: DC
  Administered 2012-07-07 – 2012-07-09 (×3): 2 via ORAL

## 2012-07-07 MED ORDER — NALBUPHINE SYRINGE 5 MG/0.5 ML
5.0000 mg | INJECTION | INTRAMUSCULAR | Status: DC | PRN
  Filled 2012-07-07: qty 1

## 2012-07-07 MED ORDER — TETANUS-DIPHTH-ACELL PERTUSSIS 5-2.5-18.5 LF-MCG/0.5 IM SUSP
0.5000 mL | Freq: Once | INTRAMUSCULAR | Status: AC
  Administered 2012-07-08: 0.5 mL via INTRAMUSCULAR
  Filled 2012-07-07: qty 0.5

## 2012-07-07 SURGICAL SUPPLY — 26 items
BENZOIN TINCTURE PRP APPL 2/3 (GAUZE/BANDAGES/DRESSINGS) ×2 IMPLANT
CHLORAPREP W/TINT 26ML (MISCELLANEOUS) ×2 IMPLANT
CLOTH BEACON ORANGE TIMEOUT ST (SAFETY) ×2 IMPLANT
DRSG COVADERM 4X10 (GAUZE/BANDAGES/DRESSINGS) ×2 IMPLANT
ELECT REM PT RETURN 9FT ADLT (ELECTROSURGICAL) ×2
ELECTRODE REM PT RTRN 9FT ADLT (ELECTROSURGICAL) ×1 IMPLANT
GLOVE BIO SURGEON STRL SZ7.5 (GLOVE) ×4 IMPLANT
GLOVE BIOGEL PI IND STRL 7.5 (GLOVE) ×1 IMPLANT
GLOVE BIOGEL PI INDICATOR 7.5 (GLOVE) ×1
GOWN PREVENTION PLUS LG XLONG (DISPOSABLE) ×6 IMPLANT
NS IRRIG 1000ML POUR BTL (IV SOLUTION) ×2 IMPLANT
PACK C SECTION WH (CUSTOM PROCEDURE TRAY) ×2 IMPLANT
RETRACTOR WND ALEXIS 25 LRG (MISCELLANEOUS) ×1 IMPLANT
RTRCTR WOUND ALEXIS 25CM LRG (MISCELLANEOUS) ×2
SLEEVE SCD COMPRESS KNEE MED (MISCELLANEOUS) ×2 IMPLANT
STRIP CLOSURE SKIN 1/2X4 (GAUZE/BANDAGES/DRESSINGS) ×2 IMPLANT
SUT CHROMIC 2 0 CT 1 (SUTURE) ×2 IMPLANT
SUT MNCRL AB 3-0 PS2 27 (SUTURE) ×2 IMPLANT
SUT PLAIN 0 NONE (SUTURE) IMPLANT
SUT PLAIN 2 0 XLH (SUTURE) ×2 IMPLANT
SUT VIC AB 0 CT1 36 (SUTURE) ×2 IMPLANT
SUT VIC AB 0 CTX 36 (SUTURE) ×3
SUT VIC AB 0 CTX36XBRD ANBCTRL (SUTURE) ×3 IMPLANT
TOWEL OR 17X24 6PK STRL BLUE (TOWEL DISPOSABLE) ×4 IMPLANT
TRAY FOLEY CATH 14FR (SET/KITS/TRAYS/PACK) ×2 IMPLANT
WATER STERILE IRR 1000ML POUR (IV SOLUTION) ×2 IMPLANT

## 2012-07-07 NOTE — Progress Notes (Signed)
Patient ID: Rachel [name], female   DOB: [birth year], 21 y.o.   MRN: [id number] Subjective: Postpartum Day 0: Cesarean Delivery for NR FHT's, at 41wks  Patient reports tolerating PO and no problems voiding.   no complaints, up ad lib without syncope Pain well controlled with po meds BF well Mood stable, bonding well Passed several small clots when up to void just now   Objective: Vital signs in last 24 hours: Temp:  [97.1 F (36.2 C)-98.7 F (37.1 C)] 97.1 F (36.2 C) (07/26 1731) Pulse Rate:  [65-92] 85  (07/26 1731) Resp:  [16-29] 18  (07/26 1731) BP: (104-141)/(47-78) 104/58 mmHg (07/26 1731) SpO2:  [94 %-100 %] 98 % (07/26 1731) Weight:  [196 lb (88.905 kg)] 196 lb (88.905 kg) (07/26 1441)  Physical Exam:  General: alert and no distress Uterine Fundus: firm Incision: sm amt blood on dressing, stable, otherwise intact Lochia: small now    [facility] 07/07/12 0706 07/06/12 2110  HGB 10.3* 12.1  HCT 31.5* 38.1    Assessment/Plan: Status post Cesarean section. Doing well postoperatively.  Continue current care Bleeding stable, will CTO closely, repeat CBC in AM   Jhamal Plucinski M 07/07/2012, 9:47 PM

## 2012-07-07 NOTE — Anesthesia Postprocedure Evaluation (Signed)
  Anesthesia Post-op Note  Patient: Scientist, physiological  Procedure(s) Performed: Procedure(s) (LRB): CESAREAN SECTION (N/A)  Patient Location: PACU  Anesthesia Type: Spinal  Level of Consciousness: awake, alert  and oriented  Airway and Oxygen Therapy: Patient Spontanous Breathing  Post-op Pain: none  Post-op Assessment: Post-op Vital signs reviewed, Patient's Cardiovascular Status Stable, Respiratory Function Stable, Patent Airway, No signs of Nausea or vomiting, Adequate PO intake, Pain level controlled, No headache and No backache  Post-op Vital Signs: Reviewed and stable  Complications: No apparent anesthesia complications

## 2012-07-07 NOTE — Anesthesia Postprocedure Evaluation (Signed)
  Anesthesia Post-op Note  Patient: Scientist, physiological  Procedure(s) Performed: Procedure(s) (LRB): CESAREAN SECTION (N/A)  Patient Location: Women's Unit  Anesthesia Type: Spinal  Level of Consciousness: awake and alert   Airway and Oxygen Therapy: Patient Spontanous Breathing  Post-op Pain: mild  Post-op Assessment: Patient's Cardiovascular Status Stable, Respiratory Function Stable, Patent Airway, No signs of Nausea or vomiting, Adequate PO intake, Pain level controlled, No headache, No backache, No residual numbness and No residual motor weakness  Post-op Vital Signs: Reviewed and stable  Complications: No apparent anesthesia complications

## 2012-07-07 NOTE — Progress Notes (Signed)
Patient ID: Macy Polio, female   DOB: 1991-08-10, 21 y.o.   MRN: [id number] .Marland KitchenSubjective:  Pt sleeping soundly,   Objective: BP 134/55  Pulse 79  Temp 98.7 F (37.1 C) (Oral)  Resp 18  Ht 5\' 3"  (1.6 m)  Wt 196 lb (88.905 kg)  BMI 34.72 kg/m2  LMP 09/24/2011  Breastfeeding? Unknown   FHT:  FHR: 150 bpm, variability: moderate,  accelerations:  Present,  decelerations:  Present recurrent variables, with several episodes of late decels UC:   irregular, every 4-6 minutes SVE:   Dilation: Fingertip Effacement (%): Thick Station: -2 Exam by:: lilliard,cnm  cytotec still in vagina - removed  vtx high, confirmed via BSUS  Assessment / Plan: IOL for PD No cervical change FHT's improved after IVF bolus and position changes   Fetal Wellbeing:  Category III Pain Control:  n/a  C/w Dr Su Hilt re: FHT's, will CTO closely, start pitocin if FHR reassuring  rv'd plan w pt, including R/B of C/S, pt agreeable   Kadasia Kassing M 07/07/2012, 1:27 AM

## 2012-07-07 NOTE — Anesthesia Preprocedure Evaluation (Signed)
Anesthesia Evaluation  Patient identified by MRN, date of birth, ID band Patient awake    Reviewed: Allergy & Precautions, H&P , NPO status , Patient's Chart, lab work & pertinent test results  Airway Mallampati: III TM Distance: >3 FB Neck ROM: Full    Dental No notable dental hx. (+) Teeth Intact   Pulmonary neg pulmonary ROS,  breath sounds clear to auscultation  Pulmonary exam normal       Cardiovascular negative cardio ROS  + Valvular Problems/Murmurs Rhythm:Regular Rate:Normal     Neuro/Psych Depression negative neurological ROS     GI/Hepatic negative GI ROS, Neg liver ROS,   Endo/Other  negative endocrine ROS  Renal/GU negative Renal ROS  negative genitourinary   Musculoskeletal negative musculoskeletal ROS (+)   Abdominal (+) + obese,   Peds  Hematology negative hematology ROS (+)   Anesthesia Other Findings   Reproductive/Obstetrics (+) Pregnancy Non-reassuring FHR tracing                           Anesthesia Physical Anesthesia Plan  ASA: II and Emergent  Anesthesia Plan: Spinal   Post-op Pain Management:    Induction:   Airway Management Planned: Natural Airway  Additional Equipment:   Intra-op Plan:   Post-operative Plan:   Informed Consent: I have reviewed the patients History and Physical, chart, labs and discussed the procedure including the risks, benefits and alternatives for the proposed anesthesia with the patient or authorized representative who has indicated his/her understanding and acceptance.   Dental advisory given  Plan Discussed with: CRNA, Anesthesiologist and Surgeon  Anesthesia Plan Comments:         Anesthesia Quick Evaluation

## 2012-07-07 NOTE — Transfer of Care (Signed)
Immediate Anesthesia Transfer of Care Note  Patient: Rachel [name]  Procedure(s) Performed: Procedure(s) (LRB): CESAREAN SECTION (N/A)  Patient Location: PACU  Anesthesia Type: Spinal  Level of Consciousness: awake, alert  and oriented  Airway & Oxygen Therapy: Patient Spontanous Breathing  Post-op Assessment: Report given to PACU RN and Post -op Vital signs reviewed and stable  Post vital signs: Reviewed  Complications: No apparent anesthesia complications

## 2012-07-07 NOTE — Progress Notes (Signed)
UR Chart review completed.  

## 2012-07-07 NOTE — Op Note (Signed)
Cesarean Section Procedure Note  Indications: 41wks undergoing induction secondary to postdates with NRFHT unable to tolerate induction  Pre-operative Diagnosis: Non-Reassuring Fetal Heart Rate   Post-operative Diagnosis: Non-Reassuring Fetal Heart Rate  Procedure: CESAREAN SECTION  Surgeon: Purcell Nails, MD    Assistants: Sanda Klein, CNM  Anesthesia: Spinal  Anesthesiologist: Tyrone Apple. Malen Gauze, MD   Procedure Details  The patient was taken to the operating room secondary to NRFHT after the risks, benefits, complications, treatment options, and expected outcomes were discussed with the patient.  The patient concurred with the proposed plan, giving informed consent which was signed and witnessed. The patient was taken to Operating Room 1, identified as Rachel [name] and the procedure verified as C-Section Delivery. A Time Out was held and the above information confirmed.  After induction of anesthesia by obtaining a spinal, the patient was prepped and draped in the usual sterile manner. A Pfannenstiel skin incision was made and carried down through the subcutaneous tissue to the underlying layer of fascia.  The fascia was incised bilaterally and extended transversely bilaterally with the Mayo scissors. Kocher clamps were placed on the inferior aspect of the fascial incision and the underlying rectus muscle was separated from the fascia. The same was done on the superior aspect of the fascial incision.  The peritoneum was identified, entered bluntly and extended manually. Alexis self-retaining retractor was placed.  The utero-vesical peritoneal reflection was incised transversely and the bladder flap was bluntly freed from the lower uterine segment. A low transverse uterine incision was made with the scalpel and extended bilaterally with the bandage scissors.  The infant was delivered in vertex position without difficulty.  After the umbilical cord was clamped and cut, the  infant was handed to the awaiting pediatricians.  Cord blood was obtained for evaluation.  The placenta was removed intact and appeared to be within normal limits. The uterus was cleared of all clots and debris. The uterine incision was closed with running interlocking sutures of 0 Vicryl and a second imbricating layer was performed as well.   There was some bleeding near the left angle and vessel isolated clamped with a hemostat and ligated with 0 vicryl.  The bladder flap was closed over this area.  Bilateral tubes and ovaries appeared to be within normal limits.  Good hemostasis was noted.  Copious irrigation was performed until clear.  The peritoneum was repaired with 2-0 chromic via a running suture.  The fascia was reapproximated with a running suture of 0 Vicryl. The subcutaneous tissue was reapproximated with 3 interrupted sutures of 2-0 plain.  The skin was reapproximated with a subcuticular suture of 3-0 monocryl.  Steristrips were applied.  Instrument, sponge, and needle counts were correct prior to abdominal closure and at the conclusion of the case.  The patient was awaiting transfer to the recovery room in good condition.  Findings: Live female infant with Apgars 9 at one minute and 9 at five minutes.  Normal appearing bilateral ovaries and fallopian tubes were noted.  Estimated Blood Loss:          Drains: 500cc foley to gravity         Total IV Fluids:         Specimens to Pathology: Placenta         Complications:  None; patient tolerated the procedure well.         Disposition: PACU - hemodynamically stable.         Condition: stable  Attending  Attestation: I performed the procedure.

## 2012-07-07 NOTE — Addendum Note (Signed)
Addendum  created 07/07/12 0902 by Suella Grove, CRNA   Modules edited:Notes Section

## 2012-07-07 NOTE — Anesthesia Procedure Notes (Signed)
Spinal  Patient location during procedure: OR Start time: 07/07/2012 3:00 AM Staffing Anesthesiologist: Keenan Dimitrov A. Performed by: anesthesiologist  Preanesthetic Checklist Completed: patient identified, site marked, surgical consent, pre-op evaluation, timeout performed, IV checked, risks and benefits discussed and monitors and equipment checked Spinal Block Patient position: sitting Prep: site prepped and draped and DuraPrep Patient monitoring: heart rate, cardiac monitor, continuous pulse ox and blood pressure Approach: midline Location: L4-5 Injection technique: single-shot Needle Needle type: Sprotte  Needle gauge: 24 G Needle length: 9 cm Needle insertion depth: 5.5 cm Assessment Sensory level: T4 Events: paresthesia Additional Notes Patient tolerated procedure well. Adequate sensory level. Transient paresthesia right leg.

## 2012-07-07 NOTE — Preoperative (Signed)
Beta Blockers   Reason not to administer Beta Blockers:Not Applicable 

## 2012-07-07 NOTE — Progress Notes (Signed)
Patient ID: Kathren Scearce, female   DOB: [birth year], 21 y.o.   MRN: [id number]  .Subjective: Pt denies pain  Objective: BP 134/55  Pulse 73  Temp 98.7 F (37.1 C) (Oral)  Resp 18  Ht 5\' 3"  (1.6 m)  Wt 196 lb (88.905 kg)  BMI 34.72 kg/m2  SpO2 94%  LMP 09/24/2011  Breastfeeding? Unknown   FHT:  FHR: 160 bpm, variability: moderate,  accelerations:  Present,  decelerations:  Present variables, w occ late decels UC:   irregular, every 2-6 minutes, run of tachysystole, at bs uterus than felt more relaxed after position change SVE:   Dilation: Fingertip Effacement (%): Thick Station: -2 Exam by:: lilliard,cnm    Assessment / Plan: IOL Repetitive variables and occ late decels Terbutaline given, position changes, IVF's and 02   Fetal Wellbeing:  Category II Pain Control:  n/a  C/w Dr Su Hilt, prep for c/s, rv'd risk of bleeding, infection, anesthesia complications or internal organ damage, pt agrees to proceed w c/s   Jonta Gastineau M 07/07/2012, 2:24 AM

## 2012-07-07 NOTE — Progress Notes (Signed)
After first fundal massage, patient had 3 very large clots and a steady, heavy flow of blood.  I contacted Toney Sang, RN from the OR to assess at the second fundal massage.  The flow had slowed down but I still notified Dr. Malen Gauze and called Sanda Klein, CNM from L&D to inform them of the situation and her VS were stable and her orientation never changed.  Lillard ordered another another bag of Pitocin (40 mcg).  She then came by to assess the patient and pushed out a few more clots.  She then ordered 800 mcg of Cytotec rectally and expressed the patient was okay to leave the PACU.  After 30 minutes and assessing the VS of the patient, the patient was transferred

## 2012-07-08 ENCOUNTER — Inpatient Hospital Stay (HOSPITAL_COMMUNITY): Admission: RE | Admit: 2012-07-08 | Payer: Medicaid Other | Source: Ambulatory Visit

## 2012-07-08 LAB — RPR: RPR Ser Ql: NONREACTIVE

## 2012-07-08 LAB — CBC
HCT: 24.5 % — ABNORMAL LOW (ref 36.0–46.0)
Hemoglobin: 8.1 g/dL — ABNORMAL LOW (ref 12.0–15.0)
MCHC: 33.1 g/dL (ref 30.0–36.0)
RBC: 2.93 MIL/uL — ABNORMAL LOW (ref 3.87–5.11)

## 2012-07-08 NOTE — Progress Notes (Signed)
Subjective: Postpartum Day 1: Cesarean Delivery Patient reports tolerating PO. No dizziness or light headedness. Ambulating well.    Objective: Vital signs in last 24 hours: Temp:  [97.1 F (36.2 C)-98.5 F (36.9 C)] 97.5 F (36.4 C) (07/27 0536) Pulse Rate:  [77-96] 96  (07/27 0536) Resp:  [18] 18  (07/27 0536) BP: (104-138)/(58-76) 109/64 mmHg (07/27 0536) SpO2:  [97 %-99 %] 99 % (07/27 0536) Weight:  [196 lb (88.905 kg)] 196 lb (88.905 kg) (07/26 1441)  Physical Exam:  General: alert and no distress Lochia: appropriate Uterine Fundus: firm Incision: dressing clean DVT Evaluation: No evidence of DVT seen on physical exam.   Basename 07/08/12 0400 07/07/12 0706  HGB 8.1* 10.3*  HCT 24.5* 31.5*    Assessment/Plan: Status post Cesarean section. Doing well postoperatively.  Breast Feeding. Undecided about contraception. May want Depoprovera. Anemia. Transfusion discussed. Risks and benefits reviewed. Declined for now. Check orthostatics.  Reghan Thul V 07/08/2012, 9:18 AM

## 2012-07-09 MED ORDER — MEDROXYPROGESTERONE ACETATE 150 MG/ML IM SUSP
150.0000 mg | Freq: Once | INTRAMUSCULAR | Status: AC
  Administered 2012-07-09: 150 mg via INTRAMUSCULAR
  Filled 2012-07-09: qty 1

## 2012-07-09 NOTE — Progress Notes (Signed)
Patient ID: Rachel [name], female   DOB: 1990/12/15, 21 y.o.   MRN: [id number] Subjective: Postpartum Day 2: Cesarean Delivery Patient reports tolerating PO, + flatus and no problems voiding.   no complaints, up ad lib without syncope, inc incisional pain when moving Pain well controlled with po meds BF well Mood stable, bonding well   Objective: Vital signs in last 24 hours: Temp:  [97.9 F (36.6 C)-99 F (37.2 C)] 97.9 F (36.6 C) (07/28 0600) Pulse Rate:  [85-99] 97  (07/28 0600) Resp:  [16-20] 20  (07/28 0600) BP: (112-123)/(67-75) 121/75 mmHg (07/28 0600) SpO2:  [98 %-99 %] 99 % (07/28 0600)  Physical Exam:  General: alert and no distress Breasts: soft, nipples intact Heart: RRR Lungs: CTAB Abdomen: BS x4 Uterine Fundus: firm Incision: healing well, steri-strips intact Lochia: appropriate DVT Evaluation: No evidence of DVT seen on physical exam. Negative Homan's sign. No significant calf/ankle edema.   Basename 07/08/12 0400 07/07/12 0706  HGB 8.1* 10.3*  HCT 24.5* 31.5*    Assessment/Plan: Status post Cesarean section. Doing well postoperatively.  Anemia - hemodynamically stable Plans - depo-provera for contraception Continue current care D/c home tmrw  Rachel [name] M 07/09/2012, 8:25 AM

## 2012-07-10 ENCOUNTER — Encounter (HOSPITAL_COMMUNITY): Payer: Self-pay | Admitting: Obstetrics and Gynecology

## 2012-07-10 DIAGNOSIS — O9081 Anemia of the puerperium: Secondary | ICD-10-CM

## 2012-07-10 DIAGNOSIS — Z3042 Encounter for surveillance of injectable contraceptive: Secondary | ICD-10-CM

## 2012-07-10 HISTORY — DX: Anemia of the puerperium: O90.81

## 2012-07-10 HISTORY — DX: Encounter for surveillance of injectable contraceptive: Z30.42

## 2012-07-10 MED ORDER — OXYCODONE-ACETAMINOPHEN 5-325 MG PO TABS: 1.0000 | ORAL_TABLET | ORAL | Status: AC | PRN

## 2012-07-10 MED ORDER — FERRALET 90 90-1 MG PO TABS: 1.0000 | ORAL_TABLET | Freq: Every day | ORAL | Status: DC

## 2012-07-10 MED ORDER — IBUPROFEN 600 MG PO TABS: 600.0000 mg | ORAL_TABLET | Freq: Four times a day (QID) | ORAL | Status: AC | PRN

## 2012-07-10 NOTE — Discharge Summary (Signed)
Obstetric Discharge Summary Reason for Admission: induction of labor Prenatal Procedures: ultrasound Intrapartum Procedures: cesarean: low cervical, transverse and epidural; Terbultaline for tachysystole s/p cytotec Postpartum Procedures: none Complications-Operative and Postpartum: none Hemoglobin  Date Value Range Status  07/08/2012 8.1* 12.0 - 15.0 g/dL Final     DELTA CHECK NOTED     REPEATED TO VERIFY  04/05/2012 10.9   Final     HCT  Date Value Range Status  07/08/2012 24.5* 36.0 - 46.0 % Final  04/05/2012 35   Final  Hospital Course:  Pt admitted at [redacted]w[redacted]d on 07/06/12 for IOL with unfavorable cx=FT/thk/-2.  Cervical ripening started w/ one dose of pv cytotec after admission.  As night progressed, FHT began having recurrent variables/lates, some periods of tachysystole.  Normal interventions for intrauterine resuscitation instituted, and Terbutaline given, and despite these, decelerations continued.  Primary c/s for NRFHT and inability to tolerate induction rec'd, and pt agreeable to proceed with c/s for mode of delivery.  C/s was performed under epidural anesthesia by Dr. [name], w/ delivery at [phone] on 07/07/12.  Pt and newborn tolerated procedure well, and macrosomia noted at time of delivery. Pt's PP recovery was complicated by anemia, but po iron supplement started and pt hemodynamically stable.  BF'ng initiated PP.  She was tol po and voiding w/o difficulty.  Up ad lib and VB stable.  Pt received DMPA on 07/09/12.  Mood was stable, and planning OP circ.   Pt was deemed to have received full benefit of hospital stay and was d/c'd home in stable condition on POD#3.    Discharge Diagnoses: Term Pregnancy-delivered, Failed induction and PP anemia; lactating; s/p primary LTCS for NRFHT; macrosomia at time of delivery; h/o depression w/o current s/s.  Discharge Information: Date: 07/10/2012 POD#3 Activity: pelvic rest Diet: iron-rich Medications: PNV, Ibuprofen, Percocet and Ferralet  90 po qd Condition: stable Instructions: refer to practice specific booklet Discharge to: home Follow-up Information    Follow up with [facility]. Schedule an appointment as soon as possible for a visit in 6 weeks. (or call as needed with any questions or concerns)          Newborn Data: Live born female "Jacquenette Shone"  (delivery provider:  Dr. [name], MD & Sanda Klein, CNM) Birth Weight: 9 lb 5 oz (4225 g) APGAR: 9, 9  Home with mother. Outpatient circ planned.  Shar Paez H 07/10/2012, 1:04 PM

## 2012-07-10 NOTE — Progress Notes (Signed)
Subjective: Postpartum Day 3: Cesarean Delivery Patient reports incisional pain, tolerating PO, + flatus, + BM and no problems voiding.  BM today.  DepoProvera yesterday per pt.  BF'ng going well and breasts feeling fuller.  Planning outpt circ.  Up w/o dizziness.  VB light.  Ready for d/c.  Female guest at bedside.    Objective: Vital signs in last 24 hours: Temp:  [98.1 F (36.7 C)-98.4 F (36.9 C)] 98.3 F (36.8 C) (07/29 1200) Pulse Rate:  [83-102] 83  (07/29 1200) Resp:  [16-20] 16  (07/29 1200) BP: (113-116)/(70-76) 116/76 mmHg (07/29 1200) SpO2:  [97 %-99 %] 98 % (07/29 1200)  Physical Exam:  General: alert, cooperative and no distress Lochia: appropriate, rubra Uterine Fundus: firm, below umbilicus Incision: SS intact; no recent drainage DVT Evaluation: No evidence of DVT seen on physical exam. Negative Homan's sign. Calf/Ankle edema is present. Mild generalized edema; peri-ankle area   Basename 07/08/12 0400  HGB 8.1*  HCT 24.5*    Assessment/Plan: Status post primary Cesarean section. Postoperative course complicated by anemia Lactating. S/p DMPA 07/09/12. Discharge home with standard precautions and return to clinic in 4-6 weeks.  Alphonsine Minium H 07/10/2012, 1:01 PM

## 2012-07-17 ENCOUNTER — Telehealth: Payer: Self-pay | Admitting: Obstetrics and Gynecology

## 2012-07-17 NOTE — Telephone Encounter (Signed)
tC TO PT. Questioning if can give children's Advil prior to circumcision. Advised we recommend only children's Tylenol. Pt verbalizes comprehension.

## 2012-07-27 ENCOUNTER — Ambulatory Visit (INDEPENDENT_AMBULATORY_CARE_PROVIDER_SITE_OTHER): Payer: Medicaid Other | Admitting: Obstetrics and Gynecology

## 2012-07-27 ENCOUNTER — Encounter: Payer: Self-pay | Admitting: Obstetrics and Gynecology

## 2012-07-27 ENCOUNTER — Telehealth: Payer: Self-pay | Admitting: Obstetrics and Gynecology

## 2012-07-27 VITALS — BP 110/78 | Wt 166.0 lb

## 2012-07-27 DIAGNOSIS — T8189XA Other complications of procedures, not elsewhere classified, initial encounter: Secondary | ICD-10-CM

## 2012-07-27 DIAGNOSIS — IMO0002 Reserved for concepts with insufficient information to code with codable children: Secondary | ICD-10-CM

## 2012-07-27 NOTE — Progress Notes (Signed)
.   Subjective:    Rachel [name] is a 21 y.o. female, G2P1011, who presents for : Pt is 3 weeks pp is here today for a incision check from her c-section done on 07/07/2012.Concerned about hard nodules at each incision angles.No discharge. No pain.    The following portions of the patient's history were reviewed and updated as appropriate: allergies, current medications, past family history.    Objective:    BP 110/78  Wt 166 lb (75.297 kg)  LMP 09/24/2011  Breastfeeding? Yes    Weight:  Wt Readings from Last 1 Encounters:  07/27/12 166 lb (75.297 kg)          BMI: There is no height on file to calculate BMI.  General Appearance: Alert, appropriate appearance for age. No acute distress Incision: skin healing well. 1 cm solid nodules 1 cm from each incision angle. No fluctuation. No erythema. No evidence of hernia.   Assessment:    3 weeks post-cesarean section with incision nodularities. Fibrosis?    Plan:    Apply local heat. To recheck at 6 weeks PP visit. Excision? Ultrasound?      Silverio Lay A MD

## 2012-07-27 NOTE — Telephone Encounter (Signed)
Spoke with pt rgs msg pt states had c section on 07/06/12 states incision has two areas that are bulging out and painful no drainage no bleeding just the area where its bulging wants eval offered pt an appt pt has appt 07/27/12 at 2:10 with SR pt voice understanding

## 2012-08-22 ENCOUNTER — Ambulatory Visit (INDEPENDENT_AMBULATORY_CARE_PROVIDER_SITE_OTHER): Payer: Medicaid Other | Admitting: Obstetrics and Gynecology

## 2012-08-22 ENCOUNTER — Encounter: Payer: Self-pay | Admitting: Obstetrics and Gynecology

## 2012-08-22 DIAGNOSIS — Z309 Encounter for contraceptive management, unspecified: Secondary | ICD-10-CM

## 2012-08-22 MED ORDER — MEDROXYPROGESTERONE ACETATE 150 MG/ML IM SUSP: 150.0000 mg | Freq: Once | INTRAMUSCULAR | Status: DC

## 2012-08-22 NOTE — Progress Notes (Signed)
Date of delivery: 07/07/2012 Female Name: Rachel [name] Vaginal delivery:no Cesarean section:yes Tubal ligation:no GDM:no Breast Feeding:no Bottle Feeding:yes Post-Partum Blues:no Abnormal pap:no Normal GU function: yes Normal GI function:yes Returning to work:no  No complaints.  Wants to cont depo.  Filed Vitals:   08/22/12 1615  BP: 110/72  Temp: 98.9 F (37.2 C)   ROS: noncontributory  Pelvic exam:  VULVA: normal appearing vulva with no masses, tenderness or lesions,  VAGINA: normal appearing vagina with normal color and discharge, no lesions, light bleeding CERVIX: normal appearing cervix without discharge or lesions,  UTERUS: uterus is normal size, shape, consistency and nontender,  ADNEXA: normal adnexa in size, nontender and no masses.  A/P Depo AEX at [place]

## 2012-08-31 ENCOUNTER — Telehealth: Payer: Self-pay | Admitting: Obstetrics and Gynecology

## 2012-08-31 NOTE — Telephone Encounter (Signed)
TRIAGE/RELEASE REQ

## 2012-08-31 NOTE — Telephone Encounter (Signed)
Returned pt's call. TC to pt. LM to return call.   

## 2012-09-01 ENCOUNTER — Encounter: Payer: Self-pay | Admitting: Obstetrics and Gynecology

## 2012-09-01 NOTE — Telephone Encounter (Signed)
Returned pt's call. Is requesting RTW note. Informed OK. Will date from [facility] visit. Pt agreeable.

## 2012-09-19 ENCOUNTER — Other Ambulatory Visit: Payer: Medicaid Other

## 2012-09-19 ENCOUNTER — Ambulatory Visit: Payer: Medicaid Other | Admitting: Obstetrics and Gynecology

## 2013-07-06 DIAGNOSIS — Z0000 Encounter for general adult medical examination without abnormal findings: Secondary | ICD-10-CM

## 2014-10-14 ENCOUNTER — Encounter: Payer: Self-pay | Admitting: Obstetrics and Gynecology

## 2015-11-21 ENCOUNTER — Encounter (HOSPITAL_COMMUNITY): Payer: Self-pay | Admitting: Emergency Medicine

## 2015-11-21 ENCOUNTER — Emergency Department (HOSPITAL_COMMUNITY): Payer: No Typology Code available for payment source

## 2015-11-21 ENCOUNTER — Emergency Department (HOSPITAL_COMMUNITY)
Admission: EM | Admit: 2015-11-21 | Discharge: 2015-11-21 | Disposition: A | Discharge status: Home / Self Care | Payer: No Typology Code available for payment source | Attending: Emergency Medicine | Admitting: Emergency Medicine

## 2015-11-21 DIAGNOSIS — Z8659 Personal history of other mental and behavioral disorders: Secondary | ICD-10-CM | POA: Insufficient documentation

## 2015-11-21 DIAGNOSIS — Z793 Long term (current) use of hormonal contraceptives: Secondary | ICD-10-CM | POA: Diagnosis not present

## 2015-11-21 DIAGNOSIS — S99911A Unspecified injury of right ankle, initial encounter: Secondary | ICD-10-CM | POA: Insufficient documentation

## 2015-11-21 DIAGNOSIS — S99921A Unspecified injury of right foot, initial encounter: Secondary | ICD-10-CM | POA: Diagnosis not present

## 2015-11-21 DIAGNOSIS — Y9389 Activity, other specified: Secondary | ICD-10-CM | POA: Diagnosis not present

## 2015-11-21 DIAGNOSIS — D649 Anemia, unspecified: Secondary | ICD-10-CM | POA: Insufficient documentation

## 2015-11-21 DIAGNOSIS — S80811A Abrasion, right lower leg, initial encounter: Secondary | ICD-10-CM | POA: Insufficient documentation

## 2015-11-21 DIAGNOSIS — Z87891 Personal history of nicotine dependence: Secondary | ICD-10-CM | POA: Insufficient documentation

## 2015-11-21 DIAGNOSIS — M79604 Pain in right leg: Secondary | ICD-10-CM

## 2015-11-21 DIAGNOSIS — S8991XA Unspecified injury of right lower leg, initial encounter: Secondary | ICD-10-CM | POA: Diagnosis not present

## 2015-11-21 DIAGNOSIS — Z8619 Personal history of other infectious and parasitic diseases: Secondary | ICD-10-CM | POA: Insufficient documentation

## 2015-11-21 DIAGNOSIS — Y998 Other external cause status: Secondary | ICD-10-CM | POA: Insufficient documentation

## 2015-11-21 DIAGNOSIS — Z79899 Other long term (current) drug therapy: Secondary | ICD-10-CM | POA: Insufficient documentation

## 2015-11-21 DIAGNOSIS — Y9289 Other specified places as the place of occurrence of the external cause: Secondary | ICD-10-CM | POA: Diagnosis not present

## 2015-11-21 DIAGNOSIS — R011 Cardiac murmur, unspecified: Secondary | ICD-10-CM | POA: Insufficient documentation

## 2015-11-21 MED ORDER — OXYCODONE-ACETAMINOPHEN 5-325 MG PO TABS
2.0000 | ORAL_TABLET | Freq: Once | ORAL | Status: AC
Start: 2015-11-21 — End: 2015-11-21
  Administered 2015-11-21: 2 via ORAL
  Filled 2015-11-21: qty 2

## 2015-11-21 MED ORDER — OXYCODONE-ACETAMINOPHEN 5-325 MG PO TABS: 2.0000 | ORAL_TABLET | ORAL | Status: DC | PRN

## 2015-11-21 MED ORDER — IBUPROFEN 800 MG PO TABS: 800.0000 mg | ORAL_TABLET | Freq: Three times a day (TID) | ORAL | Status: DC
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
## 2015-11-21 NOTE — ED Notes (Signed)
Ice packs to right ankle and right knee

## 2015-11-21 NOTE — Discharge Instructions (Signed)
You have been seen today for a leg injury. Your imaging showed no abnormalities. Follow up with PCP as needed. Return to ED should symptoms worsen.   Emergency Department Resource Guide 1) Find a Doctor and Pay Out of Pocket Although you won't have to find out who is covered by your insurance plan, it is a good idea to ask around and get recommendations. You will then need to call the office and see if the doctor you have chosen will accept you as a new patient and what types of options they offer for patients who are self-pay. Some doctors offer discounts or will set up payment plans for their patients who do not have insurance, but you will need to ask so you aren't surprised when you get to your appointment.  2) Contact Your Local Health Department Not all health departments have doctors that can see patients for sick visits, but many do, so it is worth a call to see if yours does. If you don't know where your local health department is, you can check in your phone book. The CDC also has a tool to help you locate your state's health department, and many state websites also have listings of all of their local health departments.  3) Find a Walk-in Clinic If your illness is not likely to be very severe or complicated, you may want to try a walk in clinic. These are popping up all over the country in pharmacies, drugstores, and shopping centers. They're usually staffed by nurse practitioners or physician assistants that have been trained to treat common illnesses and complaints. They're usually fairly quick and inexpensive. However, if you have serious medical issues or chronic medical problems, these are probably not your best option.  No Primary Care Doctor: - Call Health Connect at  [phone] - they can help you locate a primary care doctor that  accepts your insurance, provides certain services, etc. - Physician Referral Service- [phone]  Chronic Pain Problems: Organization          Address  Phone   Notes  [facility]  [phone]) [phone] Patients need to be referred by their primary care doctor.   Medication Assistance: Organization         Address  Phone   Notes  [facility] [address] [place], [place] [address] [phone]) [phone] --Must be a resident of [facility] -- Must have NO insurance coverage whatsoever (no Medicaid/ Medicare, etc.) -- The pt. MUST have a primary care doctor that directs their care regularly and follows them in the community   MedAssist  [phone]   [employer]  [phone]    Agencies that provide inexpensive medical care: Organization         Address  Phone   Notes  [facility]  [phone]) [phone]   [facility]    [phone]) [phone]   [facility] [address] [place], [place] [address] [phone]   [facility] [address], [place] [phone]) [phone]   Planned Parenthood    [phone]   [facility]    [phone](336) [phone]   [facility] and [facility]  [address], [place] Phone:  [phone], Fax:  [phone] Hours of Operation:  9 am - 6 pm, M-F.  Also accepts Medicaid/Medicare and self-pay.  [facility]  [address],  [place] Phone: [phone], Fax: [phone]. Hours of Operation:  8:30 am - 5:30 pm, M-F.  Also accepts Medicaid and self-pay.  [facility] [address], [place] Phone: [phone]   [place], [place], [place] [phone], Ext. [phone] Mondays & Thursdays: 7-9 AM.  First 15 patients are seen on a first come, first serve basis.    [place] Providers:  Organization         Address  Phone   Notes  [facility] [address], [place] [phone] Also accepts self-pay patients.  [facility] [address], [place]  [phone]   [place], [address], [place] [phone]   [facility] [address], [place] [phone]   Lucianne Lei [address], [place]   [phone] Only accepts [place] Access [place] patients after they have their name applied to their card.   Self-Pay (no insurance) in [facility]:  Organization         Address  Phone   Notes  Sickle Cell Patients, [facility] [place] [phone]   [facility] [place] [phone]   [facility] [place]  [place], [place], [place] [phone]   [facility]/Dr. Osei-Bonsu  [address], [place] or [address], [place] [phone] Phone number for both [place] and [place] locations is the same.  [facility]gent [facility] and [facility] [address], [place] [phone]   [facility] [address] [place] or [address] [phone]   [facility] [address], [place] [phone], phone; [phone], fax Sees patients 1st and 3rd Saturday of every month.  Must not qualify for public or private insurance (i.e. Medicaid, Medicare, [place] Health Choice, Veterans' Benefits)  Household income should be no more than 200% of the poverty level The clinic cannot treat you if you are pregnant or think you are pregnant  Sexually transmitted diseases are not treated at the clinic.    Dental Care: Organization         Address  Phone  Notes  [facility] Department of [facility] [phone] Accepts children up to age 47 who are enrolled in [place] or [place]; pregnant women with a Medicaid card; and  children who have applied for Medicaid or [place] Health Choice, but were declined, whose parents can pay a reduced fee at time of service.  [facility] Department of Operating Room Services  [address], [place] [phone] Accepts children up to age 20 who are enrolled in [place] or [place]; pregnant women with a Medicaid card; and children who have applied for Medicaid or [place] Health Choice, but were declined, whose parents can pay a reduced fee at time of service.  [facility] PRO[facility]  [place] [phone] Patients are seen by appointment only. Walk-ins are not accepted. [place] will see patients 24 years of age and older. Monday - Tuesday (8am-5pm) Most Wednesdays (8:30-5pm) $30 per visit, cash only  [place]  [place]  [address], [facility] [phone] Patients are seen by appointment only. Walk-ins are not accepted. [place] will see patients 60 years of age and older. One Wednesday Evening (Monthly: Volunteer Based).  $30 per visit, cash only  [place]  [phone] for adults; Children under age 70, call Graduate Pediatric Dentistry at [phone]. Children aged 74-14, please call [phone] to request a pediatric application.  Dental services are provided in all areas of dental care including fillings, crowns and bridges, complete and partial dentures, implants, gum treatment, root canals, and extractions. Preventive care is also provided. Treatment is provided to both adults and children. Patients are selected via a lottery and there is often a waiting list.   [facility] [address] [place]  [phone] [email or web address]   [facility] [address] [place], [place] [phone], Ext. [phone] Second and Fourth Thursday of each month, opens at 6:30 AM; Clinic ends at 9 AM.  Patients are seen on a first-come first-served  basis, and a limited number are seen during each clinic.   [facility]  [address] Hillard Danker [place], [place] [phone]   Eligibility Requirements You must have lived in [place], [place], or [place] counties for at least the last three months.   You cannot be eligible for state or federal sponsored [employer], including [employer], [place], or [employer].   You generally cannot be eligible for healthcare insurance through your employer.    How to apply: Eligibility screenings are held every Tuesday and Wednesday afternoon from 1:00 pm until 4:00 pm. You do not need an appointment for the interview!  [facility] [address] [place], [place]   [place]  [place] Department  [place]  [phone]    Behavioral Health Resources in the Community: Intensive Outpatient Programs Organization         Address  Phone  Notes  Floris Vancleave. [address] [place], [place] [phone]   [facility] [address] [place], [place]   ADS: Alcohol & Drug Svcs [address] [place], [place]   [place] [address],  [place], [place] or [phone]   Substance Abuse Resources Organization         Address  Phone  Notes  Alcohol and Drug Services  [phone]   [place]  [phone]   The [place]   Chinita Pester  [phone]   Residential & Outpatient Substance Abuse Program  [phone]   Psychological Services Organization         Address  Phone  Notes  [facility] [place]  [place]  [phone]   [place] [address], [place] or [phone]    Mobile Crisis Teams Organization          Address  Phone  Notes  [facility], Mobile Crisis Care Unit  [phone]   [facility]  [address] [place], [place]   [place] Levels [address], [place] [phone]    Self-Help/Support Groups Organization         Address  Phone             Notes  [place]. of [place] - variety  of support groups  336- [phone] Call for more information  Narcotics Anonymous (NA), [facility] [address], [employer] [place]  2 meetings at this location   Residential Facilities manager         Address  Phone  Notes  [facility] [place],    [place]  [phone]   [facility]  [address], [place] [address], [place], [place]   [place], [place] [phone] Admissions: 8am-3pm M-F  Incentives Substance [place] [address]    [place], [place] [phone]   [facility] [address] [place], [place], [place]   [facility] [address],  [place], [place]   [facility] Programs - Intensive Outpatient [address] Kristeen [address], [place], [place]   [facility] ([place].,  [place], [place] [phone] or [phone]   [facility] Services (RTS) [address] [place], [place] Accepts Medicaid  [facility] [place] [address],  [place] [phone] Substance Abuse/Addiction Treatment   [facility] Organization         Address  Phone  Notes  [facility]  [phone]   Domenic Schwab, PhD [address] Arlis Porta [place], [place]   [phone] or [phone]   [place], [place] [phone]   [facility] [address], [place], [place] [phone] Insurance/Medicaid/sponsorship  through [facility] [place] and Families [address] [place]                                    [place], [place] [phone] [place] [address], [place] [phone]    Dr. Adele Schilder  [phone]   [facility] Dept. 1) [address], [place] 2) [place] 3)  [place] [address], [place] [phone]  [phone]   [place] [phone] or [phone] (After Hours)

## 2017-01-13 ENCOUNTER — Emergency Department (HOSPITAL_COMMUNITY)
Admission: EM | Admit: 2017-01-13 | Discharge: 2017-01-14 | Disposition: A | Discharge status: Home / Self Care | Payer: BLUE CROSS/BLUE SHIELD | Attending: Emergency Medicine | Admitting: Emergency Medicine

## 2017-01-13 ENCOUNTER — Encounter (HOSPITAL_COMMUNITY): Payer: Self-pay

## 2017-01-13 DIAGNOSIS — N926 Irregular menstruation, unspecified: Secondary | ICD-10-CM | POA: Diagnosis not present

## 2017-01-13 DIAGNOSIS — Z87891 Personal history of nicotine dependence: Secondary | ICD-10-CM | POA: Diagnosis not present

## 2017-01-13 DIAGNOSIS — N938 Other specified abnormal uterine and vaginal bleeding: Secondary | ICD-10-CM | POA: Diagnosis not present

## 2017-01-13 DIAGNOSIS — N898 Other specified noninflammatory disorders of vagina: Secondary | ICD-10-CM | POA: Diagnosis present

## 2017-01-13 NOTE — ED Triage Notes (Signed)
Pt reports vaginal d/c and itching since yesterday. She states that the d/c is white and thick. Denies urinary symptoms. A&Ox4. Last had unprotected sex 2 weeks ago.

## 2017-01-14 LAB — URINALYSIS, ROUTINE W REFLEX MICROSCOPIC
Bilirubin Urine: NEGATIVE
Glucose, UA: NEGATIVE mg/dL
Ketones, ur: NEGATIVE mg/dL
Nitrite: NEGATIVE
PROTEIN: NEGATIVE mg/dL
Specific Gravity, Urine: 1.012 (ref 1.005–1.030)
pH: 5 (ref 5.0–8.0)

## 2017-01-14 LAB — WET PREP, GENITAL
CLUE CELLS WET PREP: NONE SEEN
Sperm: NONE SEEN
TRICH WET PREP: NONE SEEN

## 2017-01-14 LAB — PREGNANCY, URINE: PREG TEST UR: NEGATIVE

## 2017-01-14 LAB — GC/CHLAMYDIA PROBE AMP (~~LOC~~) NOT AT ARMC
CHLAMYDIA, DNA PROBE: NEGATIVE
NEISSERIA GONORRHEA: NEGATIVE

## 2017-01-14 NOTE — ED Provider Notes (Signed)
[facility]-EMERGENCY DEPT Provider Note   CSN: [id number] Arrival date & time: 01/13/17  2005 By signing my name below, I, Rachel [name], attest that this documentation has been prepared under the direction and in the presence of [name], PA-C. Electronically Signed: Bridgette [name], ED Scribe. 01/14/17. 12:34 AM.  History   Chief Complaint Chief Complaint  Patient presents with  . Vaginal Discharge  . Vaginal Itching    HPI The history is provided by the patient. No language interpreter was used.   HPI Comments: Rachel [name] is a 26 y.o. female (G2P1A1) with h/o chlamydia, gonorrhea, and yeast infections, who presents to the Emergency Department complaining of vaginal discharge and itching onset yesterday. Pt describes the discharge as white and thick. She states she has been using a new feminine wash the past couple of days and is concerned this is the cause of her symptoms. Pt's last unprotected sexual intercourse was 2 weeks ago. She also notes that her period has been irregular this past month. Pt is not on birth control. Pt denies abdominal pain, vomiting, vaginal pain, vaginal bleeding, dysuria, hematuria, fever, chills, or any other associated symptoms.   Past Medical History:  Diagnosis Date  . Anemia 2012  . Chlamydia   . Depo-Provera contraceptive status 07/10/2012   Received IM x1 on POD#2 07/09/12  . Depo-Provera contraceptive status 07/10/2012   Received IM x1 on POD#2 07/09/12   . Depression 2011   NO COUNSELING OR MEDS  . Gonorrhea   . Heart murmur    SINCE BIRTH  . Infection 2009   CHLAMYDIA  . 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 Infection    YEAST X1  . Lactating mother 07/10/2012  . Miscarriage   . Postpartum anemia 07/10/2012    Patient Active Problem List   Diagnosis Date Noted  . Lactating mother 07/10/2012  . Postpartum anemia 07/10/2012  . Depo-Provera contraceptive status 07/10/2012  . Abnormal glucose 05/12/2012    Past Surgical History:  Procedure  Laterality Date  . CESAREAN SECTION  07/07/2012   Procedure: CESAREAN SECTION;  Surgeon: Purcell Nails, MD;  Location: [facility];  Service: Gynecology;  Laterality: N/A;  . WISDOM TOOTH EXTRACTION     AGE 1    OB History    Gravida Para Term Preterm AB Living   2 1 1   1 1    SAB TAB Ectopic Multiple Live Births   1       1       Home Medications    Prior to Admission medications   Medication Sig Start Date End Date Taking? Authorizing Provider  Fe Cbn-Fe Gluc-FA-B12-C-DSS (FERRALET 90) 90-1 MG TABS Take 1 capsule by mouth daily. 07/10/12   [name], CNM  ibuprofen (ADVIL,MOTRIN) 800 MG tablet Take 1 tablet (800 mg total) by mouth 3 (three) times daily. 11/21/15   [name], PA-C  medroxyPROGESTERone (DEPO-PROVERA) 150 MG/ML injection Inject 1 mL (150 mg total) into the muscle once. 08/22/12   [name], MD  oxyCODONE-acetaminophen (PERCOCET/ROXICET) 5-325 MG tablet Take 2 tablets by mouth every 4 (four) hours as needed for severe pain. 11/21/15   [name], PA-C  Prenatal Vit-Fe Fumarate-FA (PRENATAL MULTIVITAMIN) TABS Take 1 tablet by mouth every morning.     Historical Provider, MD    Family History Family History  Problem Relation Age of Onset  . Hypertension Mother   . Miscarriages / [place] Mother   . Hypertension Father   . Diabetes Father   .  Heart disease Father   . Kidney disease Father     MASS; KIDNEY REMOVED  . Stroke Father   . Anesthesia problems Neg Hx   . Hypotension Neg Hx   . Malignant hyperthermia Neg Hx   . Pseudochol deficiency Neg Hx   . Asthma Sister   . Asthma Brother   . Cancer Paternal Grandmother     STOMACH  . Lupus Sister   . Lupus Cousin     Social History Social History  Substance Use Topics  . Smoking status: Former Smoker    Packs/day: 0.25    Quit date: 11/02/2011  . Smokeless tobacco: Never Used  . Alcohol use No     Allergies   Patient has no known allergies.   Review of Systems Review of Systems    Constitutional: Negative for chills and fever.  Gastrointestinal: Negative for abdominal pain and vomiting.  Genitourinary: Positive for vaginal discharge. Negative for dysuria, frequency, hematuria, urgency, vaginal bleeding and vaginal pain.  All other systems reviewed and are negative.  Physical Exam Updated Vital Signs BP 98/80 (BP Location: Right Arm)   Pulse 64   Temp 98.7 F (37.1 C) (Oral)   Resp 20   SpO2 100%   Physical Exam  Constitutional: She appears well-developed and well-nourished.  HENT:  Head: Normocephalic.  Eyes: Conjunctivae are normal.  Cardiovascular: Normal rate.   Pulmonary/Chest: Effort normal. No respiratory distress.  Abdominal: Soft. Bowel sounds are normal. She exhibits no distension. There is no tenderness.  Abdomen completely non-tender.  Genitourinary: Cervix exhibits no motion tenderness, no discharge and no friability. Right adnexum displays no tenderness. Left adnexum displays no tenderness. There is bleeding in the vagina. No vaginal discharge found.  Genitourinary Comments: Chaperone present throughout entire exam. Cervical bleeding with small amount of pooling in the vaginal vault. No visualized discharge.  Musculoskeletal: Normal range of motion.  Neurological: She is alert.  Skin: Skin is warm and dry.  Psychiatric: She has a normal mood and affect. Her behavior is normal.  Nursing note and vitals reviewed.  ED Treatments / Results  DIAGNOSTIC STUDIES: Oxygen Saturation is 100% on RA, normal by my interpretation.    COORDINATION OF CARE: 12:34 AM Discussed treatment plan with pt at bedside and pt agreed to plan.  Labs (all labs ordered are listed, but only abnormal results are displayed) Labs Reviewed - No data to display  EKG  EKG Interpretation None      Results for orders placed or performed during the hospital encounter of 01/13/17  Wet prep, genital  Result Value Ref Range   Yeast Wet Prep HPF POC PRESENT (A) NONE SEEN    Trich, Wet Prep NONE SEEN NONE SEEN   Clue Cells Wet Prep HPF POC NONE SEEN NONE SEEN   WBC, Wet Prep HPF POC FEW (A) NONE SEEN   Sperm NONE SEEN   Urinalysis, Routine w reflex microscopic  Result Value Ref Range   Color, Urine YELLOW YELLOW   APPearance CLEAR CLEAR   Specific Gravity, Urine 1.012 1.005 - 1.030   pH 5.0 5.0 - 8.0   Glucose, UA NEGATIVE NEGATIVE mg/dL   Hgb urine dipstick LARGE (A) NEGATIVE   Bilirubin Urine NEGATIVE NEGATIVE   Ketones, ur NEGATIVE NEGATIVE mg/dL   Protein, ur NEGATIVE NEGATIVE mg/dL   Nitrite NEGATIVE NEGATIVE   Leukocytes, UA MODERATE (A) NEGATIVE   RBC / HPF 0-5 0 - 5 RBC/hpf   WBC, UA 0-5 0 - 5 WBC/hpf  Bacteria, UA RARE (A) NONE SEEN   Squamous Epithelial / LPF 0-5 (A) NONE SEEN   Mucous PRESENT    Budding Yeast PRESENT   Pregnancy, urine  Result Value Ref Range   Preg Test, Ur NEGATIVE NEGATIVE     Radiology No results found.  Procedures Procedures (including critical care time)  Medications Ordered in ED Medications - No data to display   Initial Impression / Assessment and Plan / ED Course  I have reviewed the triage vital signs and the nursing notes.  Pertinent labs & imaging results that were available during my care of the patient were reviewed by me and considered in my medical decision making (see chart for details).     Patient presents for evaluation of vaginal discharge and irregular menses. She started having vaginal bleeding while in the emergency department. She is not pregnant. No evidence of pelvic infection - no discharge, tenderness, fever or pain. She can be discharged home with GYN follow up as needed.  Final Clinical Impressions(s) / ED Diagnoses   Final diagnoses:  None   1. Dysfunctional uterine bleeding  New Prescriptions New Prescriptions   No medications on file   I personally performed the services described in this documentation, which was scribed in my presence. The recorded information  has been reviewed and is accurate.      [name], PA-C 01/14/17 1610    Dione Booze, MD 01/14/17 [phone]

## 2017-07-06 ENCOUNTER — Encounter (HOSPITAL_COMMUNITY): Payer: Self-pay

## 2017-07-06 ENCOUNTER — Inpatient Hospital Stay (HOSPITAL_COMMUNITY)
Admission: AD | Admit: 2017-07-06 | Discharge: 2017-07-06 | Disposition: A | Discharge status: Home / Self Care | Payer: BLUE CROSS/BLUE SHIELD | Source: Ambulatory Visit | Attending: Obstetrics & Gynecology | Admitting: Obstetrics & Gynecology

## 2017-07-06 DIAGNOSIS — Z87891 Personal history of nicotine dependence: Secondary | ICD-10-CM | POA: Insufficient documentation

## 2017-07-06 DIAGNOSIS — O26891 Other specified pregnancy related conditions, first trimester: Secondary | ICD-10-CM | POA: Insufficient documentation

## 2017-07-06 DIAGNOSIS — R102 Pelvic and perineal pain: Secondary | ICD-10-CM

## 2017-07-06 DIAGNOSIS — Z3A01 Less than 8 weeks gestation of pregnancy: Secondary | ICD-10-CM | POA: Insufficient documentation

## 2017-07-06 DIAGNOSIS — R109 Unspecified abdominal pain: Secondary | ICD-10-CM | POA: Insufficient documentation

## 2017-07-06 LAB — POCT PREGNANCY, URINE: PREG TEST UR: POSITIVE — AB

## 2017-07-06 LAB — URINALYSIS, ROUTINE W REFLEX MICROSCOPIC
BILIRUBIN URINE: NEGATIVE
Glucose, UA: NEGATIVE mg/dL
Hgb urine dipstick: NEGATIVE
KETONES UR: NEGATIVE mg/dL
Leukocytes, UA: NEGATIVE
NITRITE: NEGATIVE
Protein, ur: NEGATIVE mg/dL
Specific Gravity, Urine: 1.019 (ref 1.005–1.030)
pH: 5 (ref 5.0–8.0)

## 2017-07-06 LAB — WET PREP, GENITAL
Clue Cells Wet Prep HPF POC: NONE SEEN
SPERM: NONE SEEN
TRICH WET PREP: NONE SEEN
YEAST WET PREP: NONE SEEN

## 2017-07-06 NOTE — Progress Notes (Addendum)
G2P1 @ [redacted] wksga. Presents to triage for severe pain along the lower abdomen for 2 weeks. Pt states informed provider about the pain when she went to OB appt last week but nothing was done for it. Pt states spot bleeding "pinkish" color started today.  Last intercourse 3 days ago.   Pt is texting while questions being asked.   2000: Provider at bs assessing. GC wetprep and pelvic exam done. bs U/S.   2050: discharge instructions given w/ pt understanding. Pt left unit via ambulatory.

## 2017-07-06 NOTE — Discharge Instructions (Signed)

## 2017-07-06 NOTE — MAU Provider Note (Signed)
History     CSN: [id number]  Arrival date and time: 07/06/17 1901   None     Chief Complaint  Patient presents with  . Vaginal Bleeding  . Abdominal Pain   HPI 26 yo G2P1001 at 8036w2d by  LMP here for evaluation of a 2-week history of cramping abdominal pain. She also noticed some bleeding with urination earlier in the day but nothing since. She started prenatal care with the health department. She denies any abnormal discharge. She has been sexually active 3 days ago. She is without any other complaints.   OB History    Gravida Para Term Preterm AB Living   3 1 1   1 1    SAB TAB Ectopic Multiple Live Births   1       1      Past Medical History:  Diagnosis Date  . Anemia 2012  . Chlamydia   . Depo-Provera contraceptive status 07/10/2012   Received IM x1 on POD#2 07/09/12  . Depo-Provera contraceptive status 07/10/2012   Received IM x1 on POD#2 07/09/12   . Depression 2011   NO COUNSELING OR MEDS  . Gonorrhea   . Heart murmur    SINCE BIRTH  . Infection 2009   CHLAMYDIA  . 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 Infection    YEAST X1  . Lactating mother 07/10/2012  . Miscarriage   . Postpartum anemia 07/10/2012    Past Surgical History:  Procedure Laterality Date  . CESAREAN SECTION  07/07/2012   Procedure: CESAREAN SECTION;  Surgeon: Purcell NailsAngela Y Roberts, MD;  Location: [facility];  Service: Gynecology;  Laterality: N/A;  . WISDOM TOOTH EXTRACTION     AGE 60    Family History  Problem Relation Age of Onset  . Hypertension Mother   . Miscarriages / [place] Mother   . Hypertension Father   . Diabetes Father   . Heart disease Father   . Kidney disease Father        MASS; KIDNEY REMOVED  . Stroke Father   . Asthma Sister   . Cancer Paternal Grandmother        STOMACH  . Asthma Brother   . Lupus Sister   . Lupus Cousin   . Anesthesia problems Neg Hx   . Hypotension Neg Hx   . Malignant hyperthermia Neg Hx   . Pseudochol deficiency Neg Hx     Social History  Substance  Use Topics  . Smoking status: Former Smoker    Packs/day: 0.25    Quit date: 11/02/2011  . Smokeless tobacco: Never Used  . Alcohol use No    Allergies: No Known Allergies  Prescriptions Prior to Admission  Medication Sig Dispense Refill Last Dose  . Fe Cbn-Fe Gluc-FA-B12-C-DSS (FERRALET 90) 90-1 MG TABS Take 1 capsule by mouth daily. 30 each 11 Taking  . ibuprofen (ADVIL,MOTRIN) 800 MG tablet Take 1 tablet (800 mg total) by mouth 3 (three) times daily. 21 tablet 0   . medroxyPROGESTERone (DEPO-PROVERA) 150 MG/ML injection Inject 1 mL (150 mg total) into the muscle once. 1 mL 3   . oxyCODONE-acetaminophen (PERCOCET/ROXICET) 5-325 MG tablet Take 2 tablets by mouth every 4 (four) hours as needed for severe pain. 6 tablet 0   . Prenatal Vit-Fe Fumarate-FA (PRENATAL MULTIVITAMIN) TABS Take 1 tablet by mouth every morning.    Taking    Review of Systems  See pertinent in HPI  Physical Exam   Last menstrual period 05/23/2017.  Physical Exam  GENERAL: Well-developed, well-nourished female in no acute distress.  LUNGS: Clear to auscultation bilaterally.  HEART: Regular rate and rhythm. ABDOMEN: Soft, nontender, nondistended.  PELVIC: Normal external female genitalia. Vagina is pink and rugated.  Normal discharge. Normal appearing cervix. Cervix is closed, thick and long.  Uterus is 8-weeks in size. No adnexal mass or tenderness. EXTREMITIES: No cyanosis, clubbing, or edema, 2+ distal pulses.  MAU Course  Procedures  MDM Bedside sono: viable intrauterine pregnancy, + heart rate visualized by patient as well  UA- negative Wet prep- negative  Assessment and Plan  26 yo here with abdominal pain in pregnancy - Reassurance provided - Precautions reviewed with the patient - Follow up as scheduled for prenatal care  Rachel [name] 07/06/2017, 8:40 PM

## 2017-07-06 NOTE — MAU Note (Signed)
Pt here with c/o severe abdominal pain for the last week. Started spotting today. Had a + pregnancy test at the health dept.

## 2017-07-08 LAB — GC/CHLAMYDIA PROBE AMP (~~LOC~~) NOT AT ARMC
CHLAMYDIA, DNA PROBE: NEGATIVE
NEISSERIA GONORRHEA: NEGATIVE

## 2017-09-05 ENCOUNTER — Encounter (HOSPITAL_COMMUNITY): Payer: Self-pay | Admitting: *Deleted

## 2017-09-05 ENCOUNTER — Inpatient Hospital Stay (HOSPITAL_COMMUNITY)
Admission: AD | Admit: 2017-09-05 | Discharge: 2017-09-05 | Disposition: A | Discharge status: Home / Self Care | Payer: Medicaid Other | Source: Ambulatory Visit | Attending: Family Medicine | Admitting: Family Medicine

## 2017-09-05 DIAGNOSIS — O26892 Other specified pregnancy related conditions, second trimester: Secondary | ICD-10-CM | POA: Diagnosis not present

## 2017-09-05 DIAGNOSIS — Z87891 Personal history of nicotine dependence: Secondary | ICD-10-CM | POA: Diagnosis not present

## 2017-09-05 DIAGNOSIS — Z3A15 15 weeks gestation of pregnancy: Secondary | ICD-10-CM | POA: Insufficient documentation

## 2017-09-05 DIAGNOSIS — O26899 Other specified pregnancy related conditions, unspecified trimester: Secondary | ICD-10-CM

## 2017-09-05 DIAGNOSIS — R1030 Lower abdominal pain, unspecified: Secondary | ICD-10-CM | POA: Diagnosis present

## 2017-09-05 DIAGNOSIS — R200 Anesthesia of skin: Secondary | ICD-10-CM | POA: Diagnosis not present

## 2017-09-05 DIAGNOSIS — R109 Unspecified abdominal pain: Secondary | ICD-10-CM | POA: Diagnosis not present

## 2017-09-05 LAB — URINALYSIS, ROUTINE W REFLEX MICROSCOPIC
Bilirubin Urine: NEGATIVE
GLUCOSE, UA: NEGATIVE mg/dL
Hgb urine dipstick: NEGATIVE
KETONES UR: NEGATIVE mg/dL
LEUKOCYTES UA: NEGATIVE
NITRITE: NEGATIVE
PROTEIN: NEGATIVE mg/dL
Specific Gravity, Urine: 1.023 (ref 1.005–1.030)
pH: 5 (ref 5.0–8.0)

## 2017-09-05 LAB — CBC
HCT: 35.3 % — ABNORMAL LOW (ref 36.0–46.0)
HEMOGLOBIN: 11.6 g/dL — AB (ref 12.0–15.0)
MCH: 27.2 pg (ref 26.0–34.0)
MCHC: 32.9 g/dL (ref 30.0–36.0)
MCV: 82.9 fL (ref 78.0–100.0)
Platelets: 252 10*3/uL (ref 150–400)
RBC: 4.26 MIL/uL (ref 3.87–5.11)
RDW: 14.5 % (ref 11.5–15.5)
WBC: 8.1 10*3/uL (ref 4.0–10.5)

## 2017-09-05 MED ORDER — CALCIUM POLYCARBOPHIL 625 MG PO TABS: 625.0000 mg | ORAL_TABLET | Freq: Every day | ORAL | 3 refills | Status: DC

## 2017-09-05 MED ORDER — HYOSCYAMINE SULFATE 0.125 MG SL SUBL
0.1250 mg | SUBLINGUAL_TABLET | Freq: Once | SUBLINGUAL | Status: AC
  Administered 2017-09-05: 0.125 mg via SUBLINGUAL
  Filled 2017-09-05: qty 1

## 2017-09-05 NOTE — MAU Provider Note (Signed)
Chief Complaint:  Abdominal Pain   First Provider Initiated Contact with Patient 09/05/17 1105     HPI: Rachel [name] is a 26 y.o. G3P1011 at 51w0dwho presents to maternity admissions reporting lower abdominal pain on left side which radiates upward, for the past 2 weeks.  Also has had numbness in her hands bilaterally for the past week or two.   Initially it would be temporary and get better but now lasts all day. Does work as Conservation officer, nature.. She denies LOF, vaginal bleeding, vaginal itching/burning, urinary symptoms, h/a, dizziness, n/v, diarrhea, constipation or fever/chills.  .  Abdominal Pain  This is a new problem. The current episode started 1 to 4 weeks ago. The onset quality is gradual. The problem occurs intermittently. The problem has been unchanged. The pain is located in the LLQ. The pain is mild. The quality of the pain is cramping and sharp. The abdominal pain does not radiate. Pertinent negatives include no anorexia, constipation, diarrhea, dysuria, fever, headaches, myalgias, nausea or vomiting. Nothing aggravates the pain. The pain is relieved by nothing. She has tried nothing for the symptoms.    RN Note: Pt reports having lower abd pain that radiate to her upper left side x 2 weeks. Also c/o numbness in her arms and hands when she sleeps.  Past Medical History: Past Medical History:  Diagnosis Date  . Anemia 2012  . Chlamydia   . Depo-Provera contraceptive status 07/10/2012   Received IM x1 on POD#2 07/09/12  . Depo-Provera contraceptive status 07/10/2012   Received IM x1 on POD#2 07/09/12   . Depression 2011   NO COUNSELING OR MEDS  . Gonorrhea   . Heart murmur    SINCE BIRTH  . Infection 2009   CHLAMYDIA  . 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 Infection    YEAST X1  . Lactating mother 07/10/2012  . Miscarriage   . Postpartum anemia 07/10/2012    Past obstetric history: OB History  Gravida Para Term Preterm AB Living  SAB TAB Ectopic Multiple Live Births   1       1    # Outcome Date GA Lbr Len/2nd Weight Sex Delivery Anes PTL Lv  3 Current           2 Term 07/07/12 [redacted]w[redacted]d  9 lb 5 oz (4.225 kg) M CS-LTranv Spinal  LIV  1 SAB 2011 [redacted]w[redacted]d             Past Surgical History: Past Surgical History:  Procedure Laterality Date  . CESAREAN SECTION  07/07/2012   Procedure: CESAREAN SECTION;  Surgeon: Purcell Nails, MD;  Location: [facility];  Service: Gynecology;  Laterality: N/A;  . WISDOM TOOTH EXTRACTION     AGE 76    Family History: Family History  Problem Relation Age of Onset  . Hypertension Mother   . Miscarriages / [place] Mother   . Hypertension Father   . Diabetes Father   . Heart disease Father   . Kidney disease Father        MASS; KIDNEY REMOVED  . Stroke Father   . Asthma Sister   . Cancer Paternal Grandmother        STOMACH  . Asthma Brother   . Lupus Sister   . Lupus Cousin   . Anesthesia problems Neg Hx   . Hypotension Neg Hx   . Malignant hyperthermia Neg Hx   . Pseudochol deficiency Neg Hx  Social History: Social History  Substance Use Topics  . Smoking status: Former Smoker    Packs/day: 0.25    Quit date: 11/02/2011  . Smokeless tobacco: Never Used  . Alcohol use No    Allergies: No Known Allergies  Meds:  Prescriptions Prior to Admission  Medication Sig Dispense Refill Last Dose  . acetaminophen (TYLENOL) 325 MG tablet Take 650 mg by mouth every 6 (six) hours as needed for mild pain or headache.   Past Week at Unknown time  . Prenatal Vit-Fe Fumarate-FA (PRENATAL MULTIVITAMIN) TABS Take 1 tablet by mouth every morning.    09/05/2017 at Unknown time    I have reviewed patient's Past Medical Hx, Surgical Hx, Family Hx, Social Hx, medications and allergies.   ROS:  Review of Systems  Constitutional: Negative for fever.  Gastrointestinal: Positive for abdominal pain. Negative for anorexia, constipation, diarrhea, nausea and vomiting.  Genitourinary: Negative for dysuria.  Musculoskeletal:  Negative for myalgias.  Neurological: Negative for headaches.   Other systems negative  Physical Exam  Patient Vitals for the past 24 hrs:  BP Temp Pulse Resp Height Weight  09/05/17 1034 (!) 126/52 98.2 F (36.8 C) 70 18  (1.6 m) 192 lb (87.1 kg)   Constitutional: Well-developed, well-nourished female in no acute distress.  Cardiovascular: normal rate and rhythm Respiratory: normal effort, clear to auscultation bilaterally GI: Abd soft, non-tender, gravid appropriate for gestational age.   No rebound or guarding. MS: Extremities nontender, no edema, normal ROM Neurologic: Alert and oriented x 4.  GU: Neg CVAT.  PELVIC EXAM: Cervix long and closed   FHT:  155   Labs:    Results for orders placed or performed during the hospital encounter of 09/05/17 (from the past 24 hour(s))  Urinalysis, Routine w reflex microscopic     Status: None   Collection Time: 09/05/17 10:36 AM  Result Value Ref Range   Color, Urine YELLOW YELLOW   APPearance CLEAR CLEAR   Specific Gravity, Urine 1.023 1.005 - 1.030   pH 5.0 5.0 - 8.0   Glucose, UA NEGATIVE NEGATIVE mg/dL   Hgb urine dipstick NEGATIVE NEGATIVE   Bilirubin Urine NEGATIVE NEGATIVE   Ketones, ur NEGATIVE NEGATIVE mg/dL   Protein, ur NEGATIVE NEGATIVE mg/dL   Nitrite NEGATIVE NEGATIVE   Leukocytes, UA NEGATIVE NEGATIVE  CBC     Status: Abnormal   Collection Time: 09/05/17 11:21 AM  Result Value Ref Range   WBC 8.1 4.0 - 10.5 K/uL   RBC 4.26 3.87 - 5.11 MIL/uL   Hemoglobin 11.6 (L) 12.0 - 15.0 g/dL   HCT 78.2 (L) 95.6 - 21.3 %   MCV 82.9 78.0 - 100.0 fL   MCH 27.2 26.0 - 34.0 pg   MCHC 32.9 30.0 - 36.0 g/dL   RDW 08.6 57.8 - 46.9 %   Platelets 252 150 - 400 K/uL     Imaging:  No results found.  MAU Course/MDM: I have ordered labs and reviewed results. Ordered a CBC to rule out acute abdominal process. It was normal.  Urine is clear.  We gave her a dose of Levsin for presumed intestinal colic.  This did resolve her  pain completely.  DIscussed fiber and probiotic intake. Marland Kitchen   Discussed probable carpal tunnel syndrome vs nerve compression. Recommend she try wrist splints at night. If they do not relieve her pain, may need ortho referral  Assessment: Single IUP at [redacted]w[redacted]d Bilateral hand numbness - Plan: Discharge patient  Abdominal pain affecting pregnancy,  antepartum - normal WBC, better with Levsin, likely colic - Plan: Discharge patient    Plan: Discharge home Recommend fiber and probiotics Recommend wrist splints Follow up in Office for prenatal visits and recheck of status  Encouraged to return here or to other Urgent Care/ED if she develops worsening of symptoms, increase in pain, fever, or other concerning symptoms.   Pt stable at time of discharge.  Wynelle Bourgeois CNM, MSN Certified Nurse-Midwife 09/05/2017 11:05 AM

## 2017-09-05 NOTE — MAU Note (Signed)
Pt reports having lower abd pain that radiate to her upper left side x 2 weeks. Also c/o numbness in her arms and hands when she sleeps.

## 2017-09-05 NOTE — Discharge Instructions (Signed)
Abdominal Pain, Adult Abdominal pain can be caused by many things. Often, abdominal pain is not serious and it gets better with no treatment or by being treated at home. However, sometimes abdominal pain is serious. Your health care provider will do a medical history and a physical exam to try to determine the cause of your abdominal pain. Follow these instructions at home:  Take over-the-counter and prescription medicines only as told by your health care provider. Do not take a laxative unless told by your health care provider.  Drink enough fluid to keep your urine clear or pale yellow.  Watch your condition for any changes.  Keep all follow-up visits as told by your health care provider. This is important. Contact a health care provider if:  Your abdominal pain changes or gets worse.  You are not hungry or you lose weight without trying.  You are constipated or have diarrhea for more than 2-3 days.  You have pain when you urinate or have a bowel movement.  Your abdominal pain wakes you up at night.  Your pain gets worse with meals, after eating, or with certain foods.  You are throwing up and cannot keep anything down.  You have a fever. Get help right away if:  Your pain does not go away as soon as your health care provider told you to expect.  You cannot stop throwing up.  Your pain is only in areas of the abdomen, such as the right side or the left lower portion of the abdomen.  You have bloody or black stools, or stools that look like tar.  You have severe pain, cramping, or bloating in your abdomen.  You have signs of dehydration, such as: ? Dark urine, very little urine, or no urine. ? Cracked lips. ? Dry mouth. ? Sunken eyes. ? Sleepiness. ? Weakness. This information is not intended to replace advice given to you by your health care provider. Make sure you discuss any questions you have with your health care provider. Document Released: 09/08/2005 Document  Revised: 06/18/2016 Document Reviewed: 05/12/2016 Elsevier Interactive Patient Education  2017 [employer] Abdominal Pain During Pregnancy Belly (abdominal) pain is common during pregnancy. Most of the time, it is not a serious problem. Other times, it can be a sign that something is wrong with the pregnancy. Always tell your doctor if you have belly pain. Follow these instructions at home: Monitor your belly pain for any changes. The following actions may help you feel better:  Do not have sex (intercourse) or put anything in your vagina until you feel better.  Rest until your pain stops.  Drink clear fluids if you feel sick to your stomach (nauseous). Do not eat solid food until you feel better.  Only take medicine as told by your doctor.  Keep all doctor visits as told.  Get help right away if:  You are bleeding, leaking fluid, or pieces of tissue come out of your vagina.  You have more pain or cramping.  You keep throwing up (vomiting).  You have pain when you pee (urinate) or have blood in your pee.  You have a fever.  You do not feel your baby moving as much.  You feel very weak or feel like passing out.  You have trouble breathing, with or without belly pain.  You have a very bad headache and belly pain.  You have fluid leaking from your vagina and belly pain.  You keep having watery poop (diarrhea).  Your belly  pain does not go away after resting, or the pain gets worse. This information is not intended to replace advice given to you by your health care provider. Make sure you discuss any questions you have with your health care provider. Document Released: 11/17/2009 Document Revised: 07/07/2016 Document Reviewed: 06/28/2013 Elsevier Interactive Patient Education  2018 [employer] Inc. Carpal Tunnel Syndrome Carpal tunnel syndrome is a condition that causes pain in your hand and arm. The carpal tunnel is a narrow area located on the palm side of your wrist.  Repeated wrist motion or certain diseases may cause swelling within the tunnel. This swelling pinches the main nerve in the wrist (median nerve). What are the causes? This condition may be caused by:  Repeated wrist motions.  Wrist injuries.  Arthritis.  A cyst or tumor in the carpal tunnel.  Fluid buildup during pregnancy.  Sometimes the cause of this condition is not known. What increases the risk? This condition is more likely to develop in:  People who have jobs that cause them to repeatedly move their wrists in the same motion, such as Health visitor.  Women.  People with certain conditions, such as: ? Diabetes. ? Obesity. ? An underactive thyroid (hypothyroidism). ? Kidney failure.  What are the signs or symptoms? Symptoms of this condition include:  A tingling feeling in your fingers, especially in your thumb, index, and middle fingers.  Tingling or numbness in your hand.  An aching feeling in your entire arm, especially when your wrist and elbow are bent for long periods of time.  Wrist pain that goes up your arm to your shoulder.  Pain that goes down into your palm or fingers.  A weak feeling in your hands. You may have trouble grabbing and holding items.  Your symptoms may feel worse during the night. How is this diagnosed? This condition is diagnosed with a medical history and physical exam. You may also have tests, including:  An electromyogram (EMG). This test measures electrical signals sent by your nerves into the muscles.  X-rays.  How is this treated? Treatment for this condition includes:  Lifestyle changes. It is important to stop doing or modify the activity that caused your condition.  Physical or occupational therapy.  Medicines for pain and inflammation. This may include medicine that is injected into your wrist.  A wrist splint.  Surgery.  Follow these instructions at home: If you have a splint:  Wear it as told by your  health care provider. Remove it only as told by your health care provider.  Loosen the splint if your fingers become numb and tingle, or if they turn cold and blue.  Keep the splint clean and dry. General instructions  Take over-the-counter and prescription medicines only as told by your health care provider.  Rest your wrist from any activity that may be causing your pain. If your condition is work related, talk to your employer about changes that can be made, such as getting a wrist pad to use while typing.  If directed, apply ice to the painful area: ? Put ice in a plastic bag. ? Place a towel between your skin and the bag. ? Leave the ice on for 20 minutes, 2-3 times per day.  Keep all follow-up visits as told by your health care provider. This is important.  Do any exercises as told by your health care provider, physical therapist, or occupational therapist. Contact a health care provider if:  You have new symptoms.  Your pain  is not controlled with medicines.  Your symptoms get worse. This information is not intended to replace advice given to you by your health care provider. Make sure you discuss any questions you have with your health care provider. Document Released: 11/26/2000 Document Revised: 04/08/2016 Document Reviewed: 04/16/2015 Elsevier Interactive Patient Education  2017 [employer].

## 2017-10-13 LAB — OB RESULTS CONSOLE RPR: RPR: NONREACTIVE

## 2017-10-13 LAB — CYTOLOGY - PAP: Pap: NEGATIVE

## 2017-10-13 LAB — CULTURE, OB URINE: Urine Culture, OB: NEGATIVE

## 2017-10-13 LAB — OB RESULTS CONSOLE GC/CHLAMYDIA
CHLAMYDIA, DNA PROBE: NEGATIVE
GC PROBE AMP, GENITAL: NEGATIVE

## 2017-10-13 LAB — OB RESULTS CONSOLE VARICELLA ZOSTER ANTIBODY, IGG: Varicella: IMMUNE

## 2017-10-13 LAB — GLUCOSE, 1 HOUR: Glucose 1 Hour: 151

## 2017-10-17 LAB — GLUCOSE, 3 HOUR: Glucose 3 Hour: 1

## 2017-10-20 ENCOUNTER — Encounter: Payer: Self-pay | Admitting: *Deleted

## 2017-10-25 ENCOUNTER — Other Ambulatory Visit: Payer: Medicaid Other

## 2017-11-07 ENCOUNTER — Encounter: Payer: Self-pay | Admitting: Obstetrics and Gynecology

## 2017-11-07 ENCOUNTER — Telehealth: Payer: Self-pay

## 2017-11-07 ENCOUNTER — Ambulatory Visit (INDEPENDENT_AMBULATORY_CARE_PROVIDER_SITE_OTHER): Payer: Medicaid Other | Admitting: Obstetrics and Gynecology

## 2017-11-07 VITALS — BP 132/54 | HR 91 | Wt 201.1 lb

## 2017-11-07 DIAGNOSIS — O0990 Supervision of high risk pregnancy, unspecified, unspecified trimester: Secondary | ICD-10-CM

## 2017-11-07 DIAGNOSIS — Z800 Family history of malignant neoplasm of digestive organs: Secondary | ICD-10-CM

## 2017-11-07 DIAGNOSIS — O0992 Supervision of high risk pregnancy, unspecified, second trimester: Secondary | ICD-10-CM | POA: Diagnosis not present

## 2017-11-07 DIAGNOSIS — Z98891 History of uterine scar from previous surgery: Secondary | ICD-10-CM | POA: Insufficient documentation

## 2017-11-07 DIAGNOSIS — O24419 Gestational diabetes mellitus in pregnancy, unspecified control: Secondary | ICD-10-CM

## 2017-11-07 LAB — POCT URINALYSIS DIP (DEVICE)
BILIRUBIN URINE: NEGATIVE
Glucose, UA: NEGATIVE mg/dL
HGB URINE DIPSTICK: NEGATIVE
Ketones, ur: NEGATIVE mg/dL
Leukocytes, UA: NEGATIVE
NITRITE: NEGATIVE
PH: 5.5 (ref 5.0–8.0)
PROTEIN: NEGATIVE mg/dL
Specific Gravity, Urine: 1.025 (ref 1.005–1.030)
UROBILINOGEN UA: 0.2 mg/dL (ref 0.0–1.0)

## 2017-11-07 NOTE — Telephone Encounter (Addendum)
Notified pt that the genetic counseling appt with MFM that was scheduled was incorrect.  I informed pt that I needed to schedule [facility].  Pt stated understanding.  Pt requested to have the blood work drawn to see if she has any risk factors for cancer.  I explained to the pt that there is risk assessment form to fill out and then we can draw the blood work.  I also informed pt that the results usually take 2-4 weeks before they come back and that we can do the testing on next OB visit scheduled on 11/23/17.  Pt stated understanding with no further questions.

## 2017-11-07 NOTE — Progress Notes (Signed)
   PRENATAL VISIT NOTE  Subjective:  Rachel [name] is a 26 y.o. G3P1011 at 734w0d being seen today for ongoing prenatal care.  She is currently monitored for the following issues for this high-risk pregnancy and has Abnormal glucose; Lactating mother; Postpartum anemia; Depo-Provera contraceptive status; Supervision of high risk pregnancy, antepartum; and H/O: C-section on their problem list.  [facility]. Patient reports no complaints.  Contractions: Not present. Vag. Bleeding: None.  Movement: Present. Denies leaking of fluid.   The following portions of the patient's history were reviewed and updated as appropriate: allergies, current medications, past family history, past medical history, past social history, past surgical history and problem list. Problem list updated.  Objective:   Vitals:   11/07/17 1334  BP: (!) 132/54  Pulse: 91  Weight: 201 lb 1.6 oz (91.2 kg)    Fetal Status: Fetal Heart Rate (bpm): 145   Movement: Present     General:  Alert, oriented and cooperative. Patient is in no acute distress.  Skin: Skin is warm and dry. No rash noted.   Cardiovascular: Normal heart rate noted  Respiratory: Normal respiratory effort, no problems with respiration noted  Abdomen: Soft, gravid, appropriate for gestational age.  Pain/Pressure: Present     Pelvic: Cervical exam deferred        Extremities: Normal range of motion.  Edema: None  Mental Status:  Normal mood and affect. Normal behavior. Normal judgment and thought content.   Assessment and Plan:  Pregnancy: G3P1011 at 1134w0d  1. Supervision of high risk pregnancy, antepartum - Enroll Patient in Babyscripts  2. H/O: C-section -Considering TOLAC  3. Gestational diabetes mellitus (GDM), antepartum, gestational diabetes method of control unspecified Reviewed diabetes in pregnancy and importance of testing blood sugar for maternal and fetal well being Nutrition referral made Start  testing sugars - US MFM OB DETAIL +14 WK; Future  3. Family history of colon cancer - offered and sent to genetic counseling for h/o grandmother and sister both diagnosed and dying from colon cancer in 30s   Preterm labor symptoms and general obstetric precautions including but not limited to vaginal bleeding, contractions, leaking of fluid and fetal movement were reviewed in detail with the patient. Please refer to After Visit Summary for other counseling recommendations.  Return in about 2 weeks (around 11/21/2017) for OB visit.   Conan BowensKelly M Davis, MD

## 2017-11-07 NOTE — Progress Notes (Signed)
OB US scheduled for January 10th @ 0800.  Pt notified.  Home Medicaid Form Completed

## 2017-11-10 ENCOUNTER — Ambulatory Visit (HOSPITAL_COMMUNITY): Payer: Medicaid Other

## 2017-11-10 ENCOUNTER — Encounter: Payer: Medicaid Other | Attending: Family Medicine | Admitting: *Deleted

## 2017-11-10 ENCOUNTER — Telehealth: Payer: Self-pay

## 2017-11-10 ENCOUNTER — Ambulatory Visit: Payer: Medicaid Other | Admitting: *Deleted

## 2017-11-10 ENCOUNTER — Encounter: Payer: Self-pay | Admitting: Obstetrics & Gynecology

## 2017-11-10 DIAGNOSIS — Z713 Dietary counseling and surveillance: Secondary | ICD-10-CM | POA: Insufficient documentation

## 2017-11-10 DIAGNOSIS — R7302 Impaired glucose tolerance (oral): Secondary | ICD-10-CM | POA: Diagnosis not present

## 2017-11-10 DIAGNOSIS — R7309 Other abnormal glucose: Secondary | ICD-10-CM

## 2017-11-10 MED ORDER — GLUCOSE BLOOD VI STRP: ORAL_STRIP | 12 refills | Status: DC

## 2017-11-10 MED ORDER — ACCU-CHEK FASTCLIX LANCETS MISC: 1.0000 | Freq: Four times a day (QID) | 12 refills | Status: DC

## 2017-11-10 MED ORDER — ACCU-CHEK GUIDE W/DEVICE KIT: 1.0000 | PACK | Freq: Four times a day (QID) | 0 refills | Status: DC

## 2017-11-10 NOTE — Telephone Encounter (Signed)
Dr. Earlene Plateravis requested that the pt have genetic counseling scheduled due to pt family hx of colon cancer x 2.  Manson PasseyContacted Andrea [facility] @ [phone] and LM for her to return call to me or Lyla SonCarrie in regards to scheduling an appt.  Pt notified that we will contact her when we have an appt scheduled.

## 2017-11-10 NOTE — Progress Notes (Signed)
  Patient was seen on 11/10/2017 for Gestational Diabetes self-management . EDD 02/27/2018. Patient states no history of GDM, but strong family history of type 2 diabetes. 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 Diet history obtained, she appears to eat good variety of all food groups and does drink juice at her evening meal. The following learning objectives were met by the patient :   States the definition of Gestational Diabetes  States why dietary management is important in controlling blood glucose  Describes the effects of carbohydrates on blood glucose levels  Demonstrates ability to create a balanced meal plan  Demonstrates carbohydrate counting   States when to check blood glucose levels  Demonstrates proper blood glucose monitoring techniques  States the effect of stress and exercise on blood glucose levels  States the importance of limiting caffeine and abstaining from alcohol and smoking  Plan:  Aim for 3 Carb Choices per meal (45 grams) +/- 1 either way  Aim for 1-2 Carbs per snack Begin reading food labels for Total Carbohydrate of foods Continue with your activity level by walking or other activity daily as tolerated Begin checking BG before breakfast and 2 hours after first bite of breakfast, lunch and dinner as directed by MD  Bring Log Book to every medical appointment and record BG into Babyscripts as well.  Take medication if directed by MD  Blood glucose monitor Rx called into pharmacy:  [place] with Fast Clix drums Patient instructed to test pre breakfast and 2 hours each meal as directed by MD  Patient instructed to monitor glucose levels: FBS: 60 - 95 mg/dl 2 hour: <120 mg/dl  Patient received the following handouts:  Nutrition Diabetes and Pregnancy  Carbohydrate Counting List  Patient will be seen for follow-up as needed.

## 2017-11-17 ENCOUNTER — Telehealth: Payer: Self-pay | Admitting: Genetic Counselor

## 2017-11-17 ENCOUNTER — Encounter: Payer: Self-pay | Admitting: Genetic Counselor

## 2017-11-17 ENCOUNTER — Telehealth: Payer: Self-pay | Admitting: General Practice

## 2017-11-17 DIAGNOSIS — O24410 Gestational diabetes mellitus in pregnancy, diet controlled: Secondary | ICD-10-CM

## 2017-11-17 MED ORDER — GLUCOSE BLOOD VI STRP: ORAL_STRIP | 12 refills | Status: DC

## 2017-11-17 MED ORDER — ACCU-CHEK FASTCLIX LANCETS MISC: 1.0000 | Freq: Four times a day (QID) | 12 refills | Status: DC

## 2017-11-17 MED ORDER — ACCU-CHEK GUIDE W/DEVICE KIT: 1.0000 | PACK | Freq: Four times a day (QID) | 0 refills | Status: DC

## 2017-11-17 NOTE — Telephone Encounter (Signed)
Received app[facility] for 12/02/17 @ 1400.  Notified pt of her appt.  Pt stated thank you with no further questions.

## 2017-11-17 NOTE — Telephone Encounter (Signed)
Genetic counseling appt has been scheduled for the pt to see Ofri on 12/21 at 2pm. Referring office will notify the pt.

## 2017-11-17 NOTE — Telephone Encounter (Signed)
Patient has not signed into BRX app yet. Called patient to inquire. Patient states she's been really busy but knows she needs to prioritize her health. Patient states Rx for testing supplies wasn't at her pharmacy when she went previously. Told patient I will reorder supplies today and encouraged her to go to pharmacy tonight to pick up supplies & register with app to use tomorrow. Sent patient mychart text sign up and told patient to reach out to me tomorrow if she is still having problems getting supplies. Patient verbalized understanding & had no questions

## 2017-11-23 ENCOUNTER — Ambulatory Visit (INDEPENDENT_AMBULATORY_CARE_PROVIDER_SITE_OTHER): Payer: Medicaid Other | Admitting: Obstetrics and Gynecology

## 2017-11-23 ENCOUNTER — Encounter: Payer: Self-pay | Admitting: Obstetrics and Gynecology

## 2017-11-23 VITALS — BP 135/63 | HR 89 | Wt 201.8 lb

## 2017-11-23 DIAGNOSIS — O24419 Gestational diabetes mellitus in pregnancy, unspecified control: Secondary | ICD-10-CM

## 2017-11-23 DIAGNOSIS — Z8632 Personal history of gestational diabetes: Secondary | ICD-10-CM | POA: Insufficient documentation

## 2017-11-23 DIAGNOSIS — O0992 Supervision of high risk pregnancy, unspecified, second trimester: Secondary | ICD-10-CM | POA: Diagnosis not present

## 2017-11-23 DIAGNOSIS — Z98891 History of uterine scar from previous surgery: Secondary | ICD-10-CM | POA: Diagnosis not present

## 2017-11-23 DIAGNOSIS — R030 Elevated blood-pressure reading, without diagnosis of hypertension: Secondary | ICD-10-CM

## 2017-11-23 DIAGNOSIS — O24415 Gestational diabetes mellitus in pregnancy, controlled by oral hypoglycemic drugs: Secondary | ICD-10-CM

## 2017-11-23 DIAGNOSIS — O0990 Supervision of high risk pregnancy, unspecified, unspecified trimester: Secondary | ICD-10-CM

## 2017-11-23 MED ORDER — TETANUS-DIPHTH-ACELL PERTUSSIS 5-2.5-18.5 LF-MCG/0.5 IM SUSP
0.5000 mL | Freq: Once | INTRAMUSCULAR | Status: AC
  Administered 2017-11-23: 0.5 mL via INTRAMUSCULAR

## 2017-11-23 MED ORDER — PREPLUS 27-1 MG PO TABS: 1.0000 | ORAL_TABLET | Freq: Every day | ORAL | 8 refills | Status: DC

## 2017-11-23 NOTE — Progress Notes (Signed)
28 wk packet given  tdap vaccine given today  

## 2017-11-23 NOTE — Addendum Note (Signed)
Addended by: Faythe CasaBELLAMY, Darlyn Repsher M on: 11/23/2017 04:16 PM   Modules accepted: Orders

## 2017-11-23 NOTE — Progress Notes (Signed)
   PRENATAL VISIT NOTE  Subjective:  Rachel [name] is a 26 y.o. G3P1011 at 3347w2d being seen today for ongoing prenatal care.  She is currently monitored for the following issues for this high-risk pregnancy and has Abnormal glucose; Lactating mother; Postpartum anemia; Depo-Provera contraceptive status; Supervision of high risk pregnancy, antepartum; H/O: C-section; and Gestational diabetes mellitus (GDM) on their problem list.  Patient reports some cramping, occasional tightening.  Contractions: Irritability. Vag. Bleeding: None.  Movement: Present. Denies leaking of fluid.   GDM, did not get supplies for testing until last week, has only tested for a few days Fasting: 84-119 PP: 80s-140s, several 140  The following portions of the patient's history were reviewed and updated as appropriate: allergies, current medications, past family history, past medical history, past social history, past surgical history and problem list. Problem list updated.  Objective:   Vitals:   11/23/17 1429 11/23/17 1432  BP: (!) 143/68 135/63  Pulse: 87 89  Weight: 201 lb 12.8 oz (91.5 kg)     Fetal Status: Fetal Heart Rate (bpm): 140   Movement: Present     General:  Alert, oriented and cooperative. Patient is in no acute distress.  Skin: Skin is warm and dry. No rash noted.   Cardiovascular: Normal heart rate noted  Respiratory: Normal respiratory effort, no problems with respiration noted  Abdomen: Soft, gravid, appropriate for gestational age.  Pain/Pressure: Present     Pelvic: Cervical exam deferred        Extremities: Normal range of motion.  Edema: None  Mental Status:  Normal mood and affect. Normal behavior. Normal judgment and thought content.   Assessment and Plan:  Pregnancy: G3P1011 at 3347w2d  1. Gestational diabetes mellitus (GDM), antepartum, gestational diabetes method of control unspecified Started testing a few days ago, not terribly uncontrolled Reviewed starting  metformin vs waiting and continuing diet Will cont diet control for now and start meds in 2 weeks with more data if needed  2. Supervision of high risk pregnancy, antepartum - RPR - CBC - HIV antibody (with reflex)  3. H/O: C-section  4. Elevated blood pressure - repeat was normal - no h/o HTN - will send baseline labs today   Preterm labor symptoms and general obstetric precautions including but not limited to vaginal bleeding, contractions, leaking of fluid and fetal movement were reviewed in detail with the patient. Please refer to After Visit Summary for other counseling recommendations.  Return in about 2 weeks (around 12/07/2017) for OB visit (MD).   Conan BowensKelly M Syndey Jaskolski, MD

## 2017-11-24 LAB — POCT URINALYSIS DIP (DEVICE)
Bilirubin Urine: NEGATIVE
Glucose, UA: NEGATIVE mg/dL
HGB URINE DIPSTICK: NEGATIVE
Leukocytes, UA: NEGATIVE
NITRITE: NEGATIVE
PH: 6 (ref 5.0–8.0)
Protein, ur: NEGATIVE mg/dL
Specific Gravity, Urine: >=1.03 (ref 1.005–1.030)
UROBILINOGEN UA: 0.2 mg/dL (ref 0.0–1.0)

## 2017-11-24 LAB — CBC
HEMATOCRIT: 33.9 % — AB (ref 34.0–46.6)
HEMOGLOBIN: 11.2 g/dL (ref 11.1–15.9)
MCH: 27.6 pg (ref 26.6–33.0)
MCHC: 33 g/dL (ref 31.5–35.7)
MCV: 84 fL (ref 79–97)
Platelets: 227 10*3/uL (ref 150–379)
RBC: 4.06 x10E6/uL (ref 3.77–5.28)
RDW: 14.3 % (ref 12.3–15.4)
WBC: 9.2 10*3/uL (ref 3.4–10.8)

## 2017-11-24 LAB — COMPREHENSIVE METABOLIC PANEL
A/G RATIO: 1.5 (ref 1.2–2.2)
ALBUMIN: 3.4 g/dL — AB (ref 3.5–5.5)
ALT: 18 IU/L (ref 0–32)
AST: 17 IU/L (ref 0–40)
Alkaline Phosphatase: 65 IU/L (ref 39–117)
BUN/Creatinine Ratio: 13 (ref 9–23)
BUN: 6 mg/dL (ref 6–20)
Bilirubin Total: <0.2 mg/dL (ref 0.0–1.2)
CALCIUM: 9 mg/dL (ref 8.7–10.2)
CO2: 21 mmol/L (ref 20–29)
Chloride: 106 mmol/L (ref 96–106)
Creatinine, Ser: 0.45 mg/dL — ABNORMAL LOW (ref 0.57–1.00)
GFR, EST AFRICAN AMERICAN: 159 mL/min/{1.73_m2} (ref >59)
GFR, EST NON AFRICAN AMERICAN: 138 mL/min/{1.73_m2} (ref >59)
GLOBULIN, TOTAL: 2.3 g/dL (ref 1.5–4.5)
Glucose: 86 mg/dL (ref 65–99)
POTASSIUM: 4.6 mmol/L (ref 3.5–5.2)
SODIUM: 139 mmol/L (ref 134–144)
TOTAL PROTEIN: 5.7 g/dL — AB (ref 6.0–8.5)

## 2017-11-24 LAB — PROTEIN / CREATININE RATIO, URINE
Creatinine, Urine: 219.3 mg/dL
Protein, Ur: 20.2 mg/dL
Protein/Creat Ratio: 92 mg/g creat (ref 0–200)

## 2017-11-24 LAB — HIV ANTIBODY (ROUTINE TESTING W REFLEX): HIV Screen 4th Generation wRfx: NONREACTIVE

## 2017-11-24 LAB — RPR: RPR Ser Ql: NONREACTIVE

## 2017-11-25 ENCOUNTER — Inpatient Hospital Stay (HOSPITAL_COMMUNITY)
Admission: AD | Admit: 2017-11-25 | Discharge: 2017-11-26 | Disposition: A | Discharge status: Home / Self Care | Payer: Medicaid Other | Source: Ambulatory Visit | Attending: Obstetrics and Gynecology | Admitting: Obstetrics and Gynecology

## 2017-11-25 DIAGNOSIS — O34219 Maternal care for unspecified type scar from previous cesarean delivery: Secondary | ICD-10-CM | POA: Insufficient documentation

## 2017-11-25 DIAGNOSIS — O0990 Supervision of high risk pregnancy, unspecified, unspecified trimester: Secondary | ICD-10-CM

## 2017-11-25 DIAGNOSIS — O24415 Gestational diabetes mellitus in pregnancy, controlled by oral hypoglycemic drugs: Secondary | ICD-10-CM

## 2017-11-25 DIAGNOSIS — Z3A26 26 weeks gestation of pregnancy: Secondary | ICD-10-CM | POA: Insufficient documentation

## 2017-11-25 DIAGNOSIS — Z98891 History of uterine scar from previous surgery: Secondary | ICD-10-CM

## 2017-11-25 DIAGNOSIS — Z87891 Personal history of nicotine dependence: Secondary | ICD-10-CM | POA: Insufficient documentation

## 2017-11-25 DIAGNOSIS — O26892 Other specified pregnancy related conditions, second trimester: Secondary | ICD-10-CM | POA: Insufficient documentation

## 2017-11-25 DIAGNOSIS — R102 Pelvic and perineal pain: Secondary | ICD-10-CM | POA: Insufficient documentation

## 2017-11-25 DIAGNOSIS — N949 Unspecified condition associated with female genital organs and menstrual cycle: Secondary | ICD-10-CM

## 2017-11-26 ENCOUNTER — Encounter (HOSPITAL_COMMUNITY): Payer: Self-pay | Admitting: *Deleted

## 2017-11-26 ENCOUNTER — Other Ambulatory Visit: Payer: Self-pay

## 2017-11-26 DIAGNOSIS — Z87891 Personal history of nicotine dependence: Secondary | ICD-10-CM | POA: Diagnosis not present

## 2017-11-26 DIAGNOSIS — O24415 Gestational diabetes mellitus in pregnancy, controlled by oral hypoglycemic drugs: Secondary | ICD-10-CM | POA: Diagnosis not present

## 2017-11-26 DIAGNOSIS — O26892 Other specified pregnancy related conditions, second trimester: Secondary | ICD-10-CM | POA: Diagnosis not present

## 2017-11-26 DIAGNOSIS — R102 Pelvic and perineal pain: Secondary | ICD-10-CM | POA: Diagnosis not present

## 2017-11-26 DIAGNOSIS — R1030 Lower abdominal pain, unspecified: Secondary | ICD-10-CM | POA: Diagnosis present

## 2017-11-26 DIAGNOSIS — Z3A26 26 weeks gestation of pregnancy: Secondary | ICD-10-CM

## 2017-11-26 DIAGNOSIS — O34219 Maternal care for unspecified type scar from previous cesarean delivery: Secondary | ICD-10-CM | POA: Diagnosis not present

## 2017-11-26 LAB — URINALYSIS, ROUTINE W REFLEX MICROSCOPIC
Bilirubin Urine: NEGATIVE
GLUCOSE, UA: NEGATIVE mg/dL
Hgb urine dipstick: NEGATIVE
Ketones, ur: NEGATIVE mg/dL
LEUKOCYTES UA: NEGATIVE
Nitrite: NEGATIVE
PH: 6 (ref 5.0–8.0)
Protein, ur: NEGATIVE mg/dL
Specific Gravity, Urine: 1.023 (ref 1.005–1.030)

## 2017-11-26 NOTE — Discharge Instructions (Signed)

## 2017-11-26 NOTE — MAU Note (Signed)
SAYS YESTERDAY STARTED HAVING SHARP PAIN  IN LOWER SIDES OF  ABD  .    ALL THE  TIME  FEELS  PELVIC DISCOMFORT

## 2017-11-26 NOTE — MAU Provider Note (Cosign Needed)
History     CSN: [id number]  Arrival date and time: 11/25/17 2356   First Provider Initiated Contact with Patient 11/26/17 0043      Chief Complaint  Patient presents with  . Abdominal Pain   26 yo G3P1011 at 28w5dpresents with bilateral lower abdominal pain that is sharp in nature. Pain comes and goes, feels worse when walking or moving. Has not tried tylenol at home. Denies new or bothersome vaginal discharge or vaginal bleeding.    OB History    Gravida Para Term Preterm AB Living   _0 SAB TAB Ectopic Multiple Live Births   1       1      Past Medical History:  Diagnosis Date  . Anemia 2012  . Chlamydia   . Depo-Provera contraceptive status 07/10/2012   Received IM x1 on POD#2 07/09/12  . Depo-Provera contraceptive status 07/10/2012   Received IM x1 on POD#2 07/09/12   . Depression 2011   NO COUNSELING OR MEDS  . Gonorrhea   . Heart murmur    SINCE BIRTH  . Infection 2009   CHLAMYDIA  . 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 Infection    YEAST X1  . Lactating mother 07/10/2012  . Miscarriage   . Postpartum anemia 07/10/2012    Past Surgical History:  Procedure Laterality Date  . CESAREAN SECTION  07/07/2012   Procedure: CESAREAN SECTION;  Surgeon: ADelice Lesch MD;  Location: [facility];  Service: Gynecology;  Laterality: N/A;  . WISDOM TOOTH EXTRACTION     AGE 43    Family History  Problem Relation Age of Onset  . Hypertension Mother   . Miscarriages / [place]   . Hypertension Father   . Diabetes Father   . Heart disease Father   . Kidney disease Father        MASS; KIDNEY REMOVED  . Stroke Father   . Asthma Sister   . Cancer Paternal Grandmother        STOMACH  . Asthma Brother   . Lupus Sister   . Lupus Cousin   . Anesthesia problems Neg Hx   . Hypotension Neg Hx   . Malignant hyperthermia Neg Hx   . Pseudochol deficiency Neg Hx     Social History   Tobacco Use  . Smoking status: Former Smoker    Packs/day: 0.25    Last  attempt to quit: 11/02/2011    Years since quitting: 6.0  . Smokeless tobacco: Never Used  Substance Use Topics  . Alcohol use: No  . Drug use: No    Allergies: No Known Allergies  Medications Prior to Admission  Medication Sig Dispense Refill Last Dose  . Prenatal Vit-Fe Fumarate-FA (PREPLUS) 27-1 MG TABS Take 1 tablet by mouth daily. 30 tablet 8 Past Week at Unknown time  . ACCU-CHEK FASTCLIX LANCETS MISC 1 each by Percutaneous route 4 (four) times daily. 100 each 12 Taking  . acetaminophen (TYLENOL) 325 MG tablet Take 650 mg by mouth every 6 (six) hours as needed for mild pain or headache.   Taking  . Blood Glucose Monitoring Suppl (ACCU-CHEK GUIDE) w/Device KIT 1 Device by Does not apply route 4 (four) times daily. 1 kit 0 Taking  . glucose blood (ACCU-CHEK GUIDE) test strip Use as instructed 100 each 12 Taking  . polycarbophil (FIBERCON) 625 MG tablet Take 1 tablet (625 mg total) by mouth daily. 30 tablet 3 Taking  Review of Systems  Constitutional: Negative for activity change and appetite change.  HENT: Negative for congestion and dental problem.   Eyes: Negative for discharge and itching.  Respiratory: Negative for apnea and chest tightness.   Cardiovascular: Negative for chest pain and leg swelling.  Gastrointestinal: Positive for abdominal pain. Negative for vomiting.  Endocrine: Negative for cold intolerance and heat intolerance.  Genitourinary: Negative for vaginal bleeding and vaginal discharge.  Musculoskeletal: Negative for arthralgias and back pain.  Neurological: Negative for dizziness and light-headedness.   Physical Exam   Blood pressure (!) 137/57, pulse 87, temperature 98.2 F (36.8 C), resp. rate 18, height _0  (1.6 m), weight 203 lb (92.1 kg), last menstrual period 05/23/2017.  Physical Exam  Constitutional: She is oriented to person, place, and time. She appears well-developed and well-nourished.  HENT:  Head: Normocephalic and atraumatic.  Eyes:  Conjunctivae are normal. Pupils are equal, round, and reactive to light.  Neck: Normal range of motion. Neck supple.  Cardiovascular: Normal rate and intact distal pulses.  Respiratory: Effort normal. No respiratory distress.  GI: Soft. She exhibits no distension. There is no tenderness. There is no rebound and no guarding.  Genitourinary: Vagina normal. No vaginal discharge found.  Genitourinary Comments: Cervical exam: closed/thick/high  Neurological: She is alert and oriented to person, place, and time.  Skin: Skin is warm and dry.  Psychiatric: She has a normal mood and affect. Her behavior is normal.    MAU Course  Procedures  EFM: 140 bpm/mod var/pos acels/ no decels Toco: no contractions  MDM Do not think this is preterm labor. Cervix is closed, no contractions on toco. Presentation more consistent with round ligament pain.   Assessment and Plan  1. Pregnancy at 26 weeks completed gestation- follow up outpatient 2. Round ligament pain- advised tylenol and heat for pain relief. Given return precautions.  Starla Deller 11/26/2017, 1:01 AM

## 2017-11-26 NOTE — MAU Note (Signed)
Have had some pain in lower abd since yesterday. Tonight went to BR and saw pink on toilet when wiped. Pain worse with walking

## 2017-11-29 ENCOUNTER — Encounter: Payer: Self-pay | Admitting: General Practice

## 2017-11-29 ENCOUNTER — Encounter: Payer: Self-pay | Admitting: *Deleted

## 2017-11-29 NOTE — Progress Notes (Signed)
Patient has triggered multiple times in BRX since late Thursday afternoon. Per chart review, [facility], plan was to review in 2 weeks and consider starting meds then. Next OB appt is 1/4. Will forward to Dr Penne LashLeggett for review.

## 2017-12-02 ENCOUNTER — Ambulatory Visit (HOSPITAL_BASED_OUTPATIENT_CLINIC_OR_DEPARTMENT_OTHER): Payer: Medicaid Other | Admitting: Genetic Counselor

## 2017-12-02 ENCOUNTER — Encounter: Payer: Self-pay | Admitting: Genetic Counselor

## 2017-12-02 ENCOUNTER — Telehealth: Payer: Self-pay | Admitting: General Practice

## 2017-12-02 ENCOUNTER — Other Ambulatory Visit: Payer: Medicaid Other

## 2017-12-02 DIAGNOSIS — Z809 Family history of malignant neoplasm, unspecified: Secondary | ICD-10-CM | POA: Diagnosis not present

## 2017-12-02 DIAGNOSIS — Z7183 Encounter for nonprocreative genetic counseling: Secondary | ICD-10-CM | POA: Diagnosis not present

## 2017-12-02 DIAGNOSIS — Z1379 Encounter for other screening for genetic and chromosomal anomalies: Secondary | ICD-10-CM

## 2017-12-02 HISTORY — DX: Encounter for other screening for genetic and chromosomal anomalies: Z13.79

## 2017-12-02 NOTE — Progress Notes (Signed)
[facility]      Initial Visit   Patient Name: Rachel [name] Patient DOB: 10/13/1991 Patient Age: 26 y.o. Encounter Date: 12/02/2017  Referring Provider: Vivien Rota, MD  Primary Care Provider: Department, [facility]   Reason for Visit: Evaluate for hereditary susceptibility to cancer    Assessment and Plan:  . Rachel [name] family history is not suggestive of a hereditary predisposition to cancer. While her paternal grandmother had colon cancer at a young age, she has over 27 siblings through her father's side of the family with only one half-sister with cancer.   . Testing is available to determine whether she has a pathogenic mutation that will impact her screening and risk-reduction for cancer. A negative result will be reassuring.  . Rachel [name] wished to pursue genetic testing and a blood sample will be sent for analysis of the 47 genes on [facility]'s Common Cancers panel (APC, ATM, AXIN2, BARD1, BMPR1A, BRCA1, BRCA2, BRIP1, CDH1, CDK4, CDKN2A, CHEK2, CTNNA1, DICER1, EPCAM, GREM1, HOXB13, KIT, MEN1, MLH1, MSH2, MSH3, MSH6, MUTYH, NBN, NF1, NTHL1, PALB2, PDGFRA, PMS2, POLD1, POLE, PTEN, RAD50, RAD51C, RAD51D, SDHA, SDHB, SDHC, SDHD, SMAD4, SMARCA4, STK11, TP53, TSC1, TSC2, VHL).   . Results should be available in approximately 2-4 weeks, at which point we will contact her and address implications for her as well as address genetic testing for at-risk family members, if needed.     Dr. Jana Hakim was available for questions concerning this case. Total time spent by me in face-to-face counseling was approximately 30 minutes.   _____________________________________________________________________   History of Present Illness: Rachel [name], a 26 y.o. female, is being seen at the [facility] due to a family history of cancer. She presents to clinic today to discuss the possibility of a hereditary  predisposition to cancer and discuss whether genetic testing is warranted.  Rachel [name] has no personal history of cancer.   Past Medical History:  Diagnosis Date  . Anemia 2012  . Chlamydia   . Depo-Provera contraceptive status 07/10/2012   Received IM x1 on POD#2 07/09/12  . Depo-Provera contraceptive status 07/10/2012   Received IM x1 on POD#2 07/09/12   . Depression 2011   NO COUNSELING OR MEDS  . Family history of cancer   . Gonorrhea   . Heart murmur    SINCE BIRTH  . Infection 2009   CHLAMYDIA  . 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 Infection    YEAST X1  . Lactating mother 07/10/2012  . Miscarriage   . Postpartum anemia 07/10/2012    Past Surgical History:  Procedure Laterality Date  . CESAREAN SECTION  07/07/2012   Procedure: CESAREAN SECTION;  Surgeon: Delice Lesch, MD;  Location: [facility];  Service: Gynecology;  Laterality: N/A;  . WISDOM TOOTH EXTRACTION     AGE 45    Social History   Socioeconomic History  . Marital status: Single    Spouse name: Not on file  . Number of children: Not on file  . Years of education: 41  . Highest education level: Not on file  Social Needs  . Financial resource strain: Not on file  . Food insecurity - worry: Not on file  . Food insecurity - inability: Not on file  . Transportation needs - medical: Not on file  . Transportation needs - non-medical: Not on file  Occupational History  . Occupation: Scientist, clinical (histocompatibility and immunogenetics): [employer]  Tobacco Use  .  Smoking status: Former Smoker    Packs/day: 0.25    Last attempt to quit: 11/02/2011    Years since quitting: 6.0  . Smokeless tobacco: Never Used  Substance and Sexual Activity  . Alcohol use: No  . Drug use: No  . Sexual activity: Yes    Partners: Male    Birth control/protection: Injection    Comment: currently pregnant  Other Topics Concern  . Not on file  Social History Narrative  . Not on file     Family History:  During the visit, a 4-generation pedigree was obtained. Family tree  will be scanned in the Media tab in Epic  Significant diagnoses include the following:  Family History  Problem Relation Age of Onset  . Hypertension Mother   . Miscarriages / [place] Mother   . Hypertension Father   . Diabetes Father   . Heart disease Father   . Kidney disease Father        MASS; KIDNEY REMOVED  . Stroke Father   . Colon polyps Father        #/type unknown  . Asthma Sister   . Cancer Paternal Grandmother        colon; deceased 16  . Asthma Brother   . Lupus Sister   . Lupus Cousin   . Cancer Other        paternal half-sister; stomach ca; deceased 16  . Colon polyps Other        paternal half-brother; #/type unknown  . Anesthesia problems Neg Hx   . Hypotension Neg Hx   . Malignant hyperthermia Neg Hx   . Pseudochol deficiency Neg Hx     Additionally, Rachel [name] has one son (age 85) and is currently pregnant. She has one full sister (age 28) and one has brother (age 33). She also has a total of 23 paternal half-siblings. Her mother (age 13) has 3 brothers. Her father died at 59. He had one sister (age 41s).  Rachel [name] ancestry is African American. There is no known Jewish ancestry and no consanguinity.  Discussion: We reviewed the characteristics, features and inheritance patterns of hereditary cancer syndromes. We discussed her risk of harboring a mutation in the context of her personal and family history. We discussed the process of genetic testing, insurance coverage and implications of results: positive, negative and variant of unknown significance (VUS).    Ms. Burr questions were answered to her satisfaction today and she is welcome to call with any additional questions or concerns. Thank you for the referral and allowing [place] to share in the care of your patient.    Steele Berg, MS, [place] Certified Genetic Counselor phone: [phone] [email or web address]   ______________________________________________________________________ For  Office Staff:  Number of people involved in session: 1 Was an Intern/ student involved with case: no

## 2017-12-02 NOTE — Progress Notes (Signed)
Spoke with Diplomatic Services operational officerN Hillman.  She will call patient and make sure she is followin the diet.  She will have patient seen early next week.

## 2017-12-02 NOTE — Telephone Encounter (Signed)
Received phone call from Dr Penne LashLeggett who advises patient be contacted regarding missed blood sugars & elevations. Patient should come in next week for OB visit. Scheduled OB visit for 12/28. Called patient and discussed blood sugars. Patient states she is checking her blood sugars 4 times a day. Patient states sometimes she forgets to put them in babyscripts because she is working. Patient reports eating breakfast & dinner but sometimes misses lunch with her job. Patient reports eating snack mid morning & evening & drinking plenty of water. Discussed with patient sooner appt for next week & to keep track of blood sugars. Told patient to bring meter with her to appt and watch her diet between now and then to see if she can lower her blood sugars as several are elevated. Patient verbalized understanding to all & had no questions

## 2017-12-09 ENCOUNTER — Ambulatory Visit (INDEPENDENT_AMBULATORY_CARE_PROVIDER_SITE_OTHER): Payer: Medicaid Other | Admitting: Obstetrics & Gynecology

## 2017-12-09 DIAGNOSIS — O24415 Gestational diabetes mellitus in pregnancy, controlled by oral hypoglycemic drugs: Secondary | ICD-10-CM

## 2017-12-09 MED ORDER — METFORMIN HCL 500 MG PO TABS: 500.0000 mg | ORAL_TABLET | Freq: Every day | ORAL | 2 refills | Status: DC

## 2017-12-09 NOTE — Progress Notes (Signed)
   PRENATAL VISIT NOTE  Subjective:  Rachel [name] is a 26 y.o. G3P1011 at 2712w4d being seen today for ongoing prenatal care.  She is currently monitored for the following issues for this high-risk pregnancy and has Supervision of high risk pregnancy, antepartum; H/O: C-section; Gestational diabetes mellitus (GDM); and Family history of cancer on their problem list.  Patient reports no complaints.  Contractions: Not present. Vag. Bleeding: None.  Movement: Present. Denies leaking of fluid.   The following portions of the patient's history were reviewed and updated as appropriate: allergies, current medications, past family history, past medical history, past social history, past surgical history and problem list. Problem list updated.  Objective:   Vitals:   12/09/17 0826  BP: (!) 134/51  Pulse: 86  Weight: 198 lb 8 oz (90 kg)    Fetal Status:     Movement: Present     General:  Alert, oriented and cooperative. Patient is in no acute distress.  Skin: Skin is warm and dry. No rash noted.   Cardiovascular: Normal heart rate noted  Respiratory: Normal respiratory effort, no problems with respiration noted  Abdomen: Soft, gravid, appropriate for gestational age.  Pain/Pressure: Present     Pelvic: Cervical exam deferred        Extremities: Normal range of motion.  Edema: None  Mental Status:  Normal mood and affect. Normal behavior. Normal judgment and thought content.   Assessment and Plan:  Pregnancy: G3P1011 at 2712w4d  1. Gestational diabetes mellitus (GDM) in third trimester controlled on oral hypoglycemic drug FBS elevated - metFORMIN (GLUCOPHAGE) 500 MG tablet; Take 1 tablet (500 mg total) by mouth daily with supper.  Dispense: 30 tablet; Refill: 2  Preterm labor symptoms and general obstetric precautions including but not limited to vaginal bleeding, contractions, leaking of fluid and fetal movement were reviewed in detail with the patient. Please refer to After  Visit Summary for other counseling recommendations.  Return in about 2 weeks (around 12/23/2017).   Scheryl DarterJames Cerria Randhawa, MD

## 2017-12-09 NOTE — Patient Instructions (Signed)

## 2017-12-16 ENCOUNTER — Encounter: Payer: Medicaid Other | Admitting: Obstetrics and Gynecology

## 2017-12-16 ENCOUNTER — Encounter: Payer: Self-pay | Admitting: Genetic Counselor

## 2017-12-16 ENCOUNTER — Ambulatory Visit: Payer: Self-pay | Admitting: Genetic Counselor

## 2017-12-16 DIAGNOSIS — Z1379 Encounter for other screening for genetic and chromosomal anomalies: Secondary | ICD-10-CM

## 2017-12-16 NOTE — Progress Notes (Signed)
Cancer Genetics Clinic       Genetic Test Results    Patient Name: Rachel [name] Patient DOB: Apr 15, 1991 Patient Age: 27 y.o. Encounter Date: 12/16/2017  Referring Provider: Vivien Rota, MD  Primary Care Provider: Department, [facility]   Rachel [name] was called today to discuss genetic test results. Please see the Genetics note from her visit on 12/02/2017 for a detailed discussion of her personal and family history.  Genetic Testing: At the time of Rachel [name]'s visit, she decided to pursue genetic testing of multiple genes associated with hereditary susceptibility to cancer. Testing included sequencing and deletion/duplication analysis. Testing did not reveal any pathogenic mutation in any of these genes.  A copy of the genetic test report will be scanned into Epic under the media tab.  The genes analyzed were the 47 genes on [facility]'s Common Cancers panel (APC, ATM, AXIN2, BARD1, BMPR1A, BRCA1, BRCA2, BRIP1, CDH1, CDK4, CDKN2A, CHEK2, CTNNA1, DICER1, EPCAM, GREM1, HOXB13, KIT, MEN1, MLH1, MSH2, MSH3, MSH6, MUTYH, NBN, NF1, NTHL1, PALB2, PDGFRA, PMS2, POLD1, POLE, PTEN, RAD50, RAD51C, RAD51D, SDHA, SDHB, SDHC, SDHD, SMAD4, SMARCA4, STK11, TP53, TSC1, TSC2, VHL).  Since the current test is not perfect, it is possible that there may be a gene mutation that current testing cannot detect, but that chance is small. It is possible that a different genetic factor, which has not yet been discovered or is not on this panel, is responsible for the cancer diagnoses in the family. Again, the likelihood of this is low. No additional testing is recommended at this time for Rachel [name].  A Variant of Uncertain Significance was detected: MSH3 c.[id number] ([id number]). This is still considered a normal result. While at this time, it is unknown if this finding is associated with increased cancer risk, the majority of these variants get reclassified to be inconsequential.  We emphasized that medical management should not be based on this finding. With time, we suspect the lab will determine the significance, if any. If we do learn more about it, we will try to contact Rachel [name] to discuss it further. It is important to stay in touch with [place] periodically and keep the address and phone number up to date.  Cancer Screening: These results are reassuring and indicate that Rachel [name] does not likely have an increased risk of cancer due to a mutation in one of these genes. We discussed undergoing cancer screenings for individuals in the general population. The [employer]  recommends that women follow the breast screening recommendations below, but that these may need to be modified based on other risk factors such as dense breasts, biopsy history or family history.  Breast awareness - Women should be familiar with their breasts and promptly report changes to their healthcare provider.   Between ages 53-39: Breast exam, risk assessment, and risk reduction counseling by the provider every 1-3 years.   Starting at age 18: Breast exam, risk assessment, and risk reduction counseling by the provider and mammogram every year. The provider may discuss screening with tomosynthesis.  Ms. Bluestein is also recommended to undergo a yearly gynecologic exam and initiate colon cancer screening at age 64.  Family Members: Family members may be at some increased risk of developing cancer, over the general population risk, simply due to the family history. Women are recommended to have a yearly mammogram beginning at age 66, a yearly clinical breast exam, a yearly gynecologic  exam and perform monthly breast self-exams. Colon cancer screening is recommended to begin by age 14 in both men and women, unless there is a family history of colon cancer or colon polyps or an individual has a personal history to warrant initiating screening at a younger age.  Any relative who had cancer  at a young age or had a particularly rare cancer may also wish to pursue genetic testing. Genetic counselors can be located in other cities, by visiting the website of the [employer] (ArtistMovie.se) and Field seismologist for a Dietitian by zip code.   Family members are not recommended to get tested for the above VUS outside of a research protocol as this finding has no implications for their medical management.    Lastly, cancer genetics is a rapidly advancing field and it is possible that new genetic tests will be appropriate for Rachel [name] in the future. We encourage her to remain in contact with [place] on an annual basis so we can update her personal and family histories, and let her know of advances in cancer genetics that may benefit the family. Our contact number was provided. Rachel [name] is welcome to call anytime with additional questions.     Steele Berg, MS, [place] Certified Genetic Counselor phone: [phone]

## 2017-12-22 ENCOUNTER — Encounter: Payer: Self-pay | Admitting: General Practice

## 2017-12-22 ENCOUNTER — Ambulatory Visit (HOSPITAL_COMMUNITY)
Admission: RE | Admit: 2017-12-22 | Discharge: 2017-12-22 | Disposition: A | Discharge status: Home / Self Care | Payer: Medicaid Other | Source: Ambulatory Visit | Attending: Obstetrics and Gynecology | Admitting: Obstetrics and Gynecology

## 2017-12-22 ENCOUNTER — Other Ambulatory Visit: Payer: Self-pay | Admitting: Obstetrics and Gynecology

## 2017-12-22 ENCOUNTER — Other Ambulatory Visit (HOSPITAL_COMMUNITY): Payer: Self-pay | Admitting: *Deleted

## 2017-12-22 ENCOUNTER — Encounter (HOSPITAL_COMMUNITY): Payer: Self-pay

## 2017-12-22 DIAGNOSIS — O321XX0 Maternal care for breech presentation, not applicable or unspecified: Secondary | ICD-10-CM | POA: Diagnosis not present

## 2017-12-22 DIAGNOSIS — O24419 Gestational diabetes mellitus in pregnancy, unspecified control: Secondary | ICD-10-CM | POA: Diagnosis present

## 2017-12-22 DIAGNOSIS — Z3689 Encounter for other specified antenatal screening: Secondary | ICD-10-CM

## 2017-12-22 DIAGNOSIS — Q620 Congenital hydronephrosis: Secondary | ICD-10-CM | POA: Insufficient documentation

## 2017-12-22 DIAGNOSIS — O283 Abnormal ultrasonic finding on antenatal screening of mother: Secondary | ICD-10-CM | POA: Diagnosis not present

## 2017-12-22 DIAGNOSIS — O09893 Supervision of other high risk pregnancies, third trimester: Secondary | ICD-10-CM | POA: Diagnosis not present

## 2017-12-22 DIAGNOSIS — O34219 Maternal care for unspecified type scar from previous cesarean delivery: Secondary | ICD-10-CM | POA: Insufficient documentation

## 2017-12-22 DIAGNOSIS — O99213 Obesity complicating pregnancy, third trimester: Secondary | ICD-10-CM

## 2017-12-22 DIAGNOSIS — O24415 Gestational diabetes mellitus in pregnancy, controlled by oral hypoglycemic drugs: Secondary | ICD-10-CM

## 2017-12-22 DIAGNOSIS — Z3A30 30 weeks gestation of pregnancy: Secondary | ICD-10-CM | POA: Diagnosis not present

## 2017-12-22 DIAGNOSIS — Z98891 History of uterine scar from previous surgery: Secondary | ICD-10-CM

## 2017-12-22 NOTE — Progress Notes (Signed)
Patient triggered in baby scripts. Per chart review, patient has OB appt tomorrow.

## 2017-12-23 ENCOUNTER — Ambulatory Visit (INDEPENDENT_AMBULATORY_CARE_PROVIDER_SITE_OTHER): Payer: Medicaid Other | Admitting: Obstetrics & Gynecology

## 2017-12-23 ENCOUNTER — Encounter: Payer: Self-pay | Admitting: Obstetrics & Gynecology

## 2017-12-23 ENCOUNTER — Encounter: Payer: Medicaid Other | Admitting: Obstetrics and Gynecology

## 2017-12-23 VITALS — BP 115/57 | HR 99 | Wt 198.9 lb

## 2017-12-23 DIAGNOSIS — O24415 Gestational diabetes mellitus in pregnancy, controlled by oral hypoglycemic drugs: Secondary | ICD-10-CM

## 2017-12-23 DIAGNOSIS — O0990 Supervision of high risk pregnancy, unspecified, unspecified trimester: Secondary | ICD-10-CM

## 2017-12-23 MED ORDER — METFORMIN HCL 500 MG PO TABS: 500.0000 mg | ORAL_TABLET | Freq: Two times a day (BID) | ORAL | 2 refills | Status: DC

## 2017-12-23 NOTE — Patient Instructions (Signed)

## 2017-12-23 NOTE — Progress Notes (Signed)
US result reviewed   PRENATAL VISIT NOTE  Subjective:  Rachel [name] is a 27 y.o. G3P1011 at 3559w1d being seen today for ongoing prenatal care.  She is currently monitored for the following issues for this high-risk pregnancy and has Supervision of high risk pregnancy, antepartum; H/O: C-section; Gestational diabetes mellitus (GDM); Family history of cancer; and Genetic testing on their problem list.  Patient reports no complaints.  Contractions: Not present. Vag. Bleeding: None.  Movement: Present. Denies leaking of fluid.   The following portions of the patient's history were reviewed and updated as appropriate: allergies, current medications, past family history, past medical history, past social history, past surgical history and problem list. Problem list updated.  Objective:   Vitals:   12/23/17 0943  BP: (!) 115/57  Pulse: 99  Weight: 198 lb 14.4 oz (90.2 kg)    Fetal Status: Fetal Heart Rate (bpm): 146   Movement: Present     General:  Alert, oriented and cooperative. Patient is in no acute distress.  Skin: Skin is warm and dry. No rash noted.   Cardiovascular: Normal heart rate noted  Respiratory: Normal respiratory effort, no problems with respiration noted  Abdomen: Soft, gravid, appropriate for gestational age.  Pain/Pressure: Present     Pelvic: Cervical exam deferred        Extremities: Normal range of motion.  Edema: None  Mental Status:  Normal mood and affect. Normal behavior. Normal judgment and thought content.   Assessment and Plan:  Pregnancy: G3P1011 at 4959w1d  1. Gestational diabetes mellitus (GDM) in third trimester controlled on oral hypoglycemic drug FBS 100-111, increase med dose - metFORMIN (GLUCOPHAGE) 500 MG tablet; Take 1 tablet (500 mg total) by mouth 2 (two) times daily with a meal.  Dispense: 30 tablet; Refill: 2  Preterm labor symptoms and general obstetric precautions including but not limited to vaginal bleeding, contractions,  leaking of fluid and fetal movement were reviewed in detail with the patient. Please refer to After Visit Summary for other counseling recommendations.  Return in about 1 week (around 12/30/2017) for NST, BPP. Dates were recal[facility]  Chanoch Mccleery, MD

## 2017-12-30 ENCOUNTER — Ambulatory Visit (INDEPENDENT_AMBULATORY_CARE_PROVIDER_SITE_OTHER): Payer: Medicaid Other | Admitting: Obstetrics & Gynecology

## 2017-12-30 ENCOUNTER — Ambulatory Visit (INDEPENDENT_AMBULATORY_CARE_PROVIDER_SITE_OTHER): Payer: Medicaid Other | Admitting: *Deleted

## 2017-12-30 ENCOUNTER — Ambulatory Visit: Payer: Self-pay

## 2017-12-30 VITALS — BP 144/64 | HR 99 | Wt 200.4 lb

## 2017-12-30 DIAGNOSIS — Z98891 History of uterine scar from previous surgery: Secondary | ICD-10-CM

## 2017-12-30 DIAGNOSIS — O24415 Gestational diabetes mellitus in pregnancy, controlled by oral hypoglycemic drugs: Secondary | ICD-10-CM

## 2017-12-30 DIAGNOSIS — O0990 Supervision of high risk pregnancy, unspecified, unspecified trimester: Secondary | ICD-10-CM

## 2017-12-30 DIAGNOSIS — O34219 Maternal care for unspecified type scar from previous cesarean delivery: Secondary | ICD-10-CM

## 2017-12-30 DIAGNOSIS — O163 Unspecified maternal hypertension, third trimester: Secondary | ICD-10-CM

## 2017-12-30 LAB — POCT URINALYSIS DIP (DEVICE)
BILIRUBIN URINE: NEGATIVE
Glucose, UA: NEGATIVE mg/dL
Ketones, ur: NEGATIVE mg/dL
NITRITE: NEGATIVE
Protein, ur: NEGATIVE mg/dL
Specific Gravity, Urine: >=1.03 (ref 1.005–1.030)
Urobilinogen, UA: 0.2 mg/dL (ref 0.0–1.0)
pH: 6 (ref 5.0–8.0)

## 2017-12-30 MED ORDER — GLYBURIDE 2.5 MG PO TABS: 2.5000 mg | ORAL_TABLET | Freq: Every day | ORAL | 0 refills | Status: DC

## 2017-12-30 MED ORDER — ASPIRIN EC 81 MG PO TBEC
81.0000 mg | DELAYED_RELEASE_TABLET | Freq: Every day | ORAL | 1 refills | Status: DC
Start: 2017-12-30 — End: 2018-02-10

## 2017-12-30 NOTE — Progress Notes (Signed)

## 2017-12-30 NOTE — Progress Notes (Signed)
Pt denies H/A or visual disturbances. US for growth scheduled 2/7, BPP added

## 2017-12-30 NOTE — Addendum Note (Signed)
Addended by: Jill SideAY, Carla Rashad L on: 12/30/2017 11:44 AM   Modules accepted: Orders

## 2017-12-30 NOTE — Progress Notes (Signed)
   PRENATAL VISIT NOTE  Subjective:  Rachel [name] is a 27 y.o. G3P1011 at 503w1d being seen today for ongoing prenatal care.  She is currently monitored for the following issues for this high-risk pregnancy and has Supervision of high risk pregnancy, antepartum; H/O: C-section; Gestational diabetes mellitus (GDM); Family history of cancer; and Genetic testing on their problem list.  Patient reports +FM, No LOF and no VB.   The following portions of the patient's history were reviewed and updated as appropriate: allergies, current medications, past family history, past medical history, past social history, past surgical history and problem list. Problem list updated.  objective:  BP (!) 149/68   Pulse 99   Wt 200 lb 6.4 oz (90.9 kg)   LMP 05/23/2017   BMI 35.50 kg/m   Fetal Status:           General:  Alert, oriented and cooperative. Patient is in no acute distress.  Skin: Skin is warm and dry. No rash noted.   Cardiovascular: Normal heart rate noted  Respiratory: Normal respiratory effort, no problems with respiration noted  Abdomen: Soft, gravid, appropriate for gestational age.        Pelvic: Cervical exam deferred        Extremities: Normal range of motion.     Mental Status:  Normal mood and affect. Normal behavior. Normal judgment and thought content.   Assessment and Plan:  Pregnancy: G3P1011 at 623w1d  1. Supervision of high risk pregnancy, antepartum S/p BPP 10/10 today  2. Gestational diabetes mellitus (GDM) in third trimester controlled on oral hypoglycemic drug Pt is on Metformin 500mg  bid 100% of her fasting glucose values are elevated (99-140). The rest of her vsalues were WNL. Will begin Glyburide 2.5mg  po qhs begin ASA  3. H/O: C-section Mode of delivery: Pt desires TOLAC.  VBAC consent signed today  4. BP elevated today PIH labs obtained.       Preterm labor symptoms and general obstetric precautions including but not limited to vaginal  bleeding, contractions, leaking of fluid and fetal movement were reviewed in detail with the patient. Please refer to After Visit Summary for other counseling recommendations.  Return in about 2 weeks (around 01/13/2018) for NST/BPP, HOB as scheduled. Pt to f/u in 1 week   Willodean Rosenthalarolyn Harraway-Smith, MD

## 2017-12-31 LAB — COMP. METABOLIC PANEL (12)
ALBUMIN: 3.5 g/dL (ref 3.5–5.5)
AST: 15 IU/L (ref 0–40)
Albumin/Globulin Ratio: 1.4 (ref 1.2–2.2)
Alkaline Phosphatase: 100 IU/L (ref 39–117)
BUN / CREAT RATIO: 14 (ref 9–23)
BUN: 6 mg/dL (ref 6–20)
Bilirubin Total: <0.2 mg/dL (ref 0.0–1.2)
CALCIUM: 9.4 mg/dL (ref 8.7–10.2)
Chloride: 107 mmol/L — ABNORMAL HIGH (ref 96–106)
Creatinine, Ser: 0.42 mg/dL — ABNORMAL LOW (ref 0.57–1.00)
GFR calc non Af Amer: 141 mL/min/{1.73_m2} (ref >59)
GFR, EST AFRICAN AMERICAN: 162 mL/min/{1.73_m2} (ref >59)
GLOBULIN, TOTAL: 2.5 g/dL (ref 1.5–4.5)
Glucose: 124 mg/dL — ABNORMAL HIGH (ref 65–99)
POTASSIUM: 3.9 mmol/L (ref 3.5–5.2)
SODIUM: 140 mmol/L (ref 134–144)
TOTAL PROTEIN: 6 g/dL (ref 6.0–8.5)

## 2017-12-31 LAB — PROTEIN / CREATININE RATIO, URINE
CREATININE, UR: 122.2 mg/dL
Protein, Ur: 21.2 mg/dL
Protein/Creat Ratio: 173 mg/g creat (ref 0–200)

## 2017-12-31 LAB — CBC
HEMOGLOBIN: 11.1 g/dL (ref 11.1–15.9)
Hematocrit: 34.3 % (ref 34.0–46.6)
MCH: 27 pg (ref 26.6–33.0)
MCHC: 32.4 g/dL (ref 31.5–35.7)
MCV: 84 fL (ref 79–97)
Platelets: 210 10*3/uL (ref 150–379)
RBC: 4.11 x10E6/uL (ref 3.77–5.28)
RDW: 14.5 % (ref 12.3–15.4)
WBC: 8.8 10*3/uL (ref 3.4–10.8)

## 2018-01-03 ENCOUNTER — Encounter: Payer: Self-pay | Admitting: *Deleted

## 2018-01-05 ENCOUNTER — Encounter: Payer: Self-pay | Admitting: General Practice

## 2018-01-05 NOTE — Progress Notes (Signed)
Patient triggered in babyscripts. Per chart review, patient has OB appt tomorrow- can address then

## 2018-01-06 ENCOUNTER — Ambulatory Visit (INDEPENDENT_AMBULATORY_CARE_PROVIDER_SITE_OTHER): Payer: Medicaid Other | Admitting: *Deleted

## 2018-01-06 ENCOUNTER — Ambulatory Visit: Payer: Self-pay

## 2018-01-06 ENCOUNTER — Ambulatory Visit (INDEPENDENT_AMBULATORY_CARE_PROVIDER_SITE_OTHER): Payer: Medicaid Other | Admitting: Obstetrics & Gynecology

## 2018-01-06 VITALS — BP 134/63 | HR 96 | Wt 199.0 lb

## 2018-01-06 DIAGNOSIS — O24415 Gestational diabetes mellitus in pregnancy, controlled by oral hypoglycemic drugs: Secondary | ICD-10-CM | POA: Diagnosis not present

## 2018-01-06 DIAGNOSIS — O0990 Supervision of high risk pregnancy, unspecified, unspecified trimester: Secondary | ICD-10-CM

## 2018-01-06 DIAGNOSIS — Z98891 History of uterine scar from previous surgery: Secondary | ICD-10-CM

## 2018-01-06 DIAGNOSIS — O0993 Supervision of high risk pregnancy, unspecified, third trimester: Secondary | ICD-10-CM

## 2018-01-06 NOTE — Progress Notes (Signed)
Pt states her pharmacy does not have Glyburide in stock - she has checked with them twice this week.

## 2018-01-06 NOTE — Progress Notes (Signed)
   PRENATAL VISIT NOTE  Subjective:  Rachel [name] is a 27 y.o. G3P1011 at 5173w1d being seen today for ongoing prenatal care.  She is currently monitored for the following issues for this high-risk pregnancy and has Supervision of high risk pregnancy, antepartum; H/O: C-section; Gestational diabetes mellitus (GDM); Family history of cancer; and Genetic testing on their problem list.  Patient reports no complaints.  Contractions: Not present. Vag. Bleeding: None.  Movement: Present. Denies leaking of fluid.   The following portions of the patient's history were reviewed and updated as appropriate: allergies, current medications, past family history, past medical history, past social history, past surgical history and problem list. Problem list updated.  Objective:   Vitals:   01/06/18 1040  BP: 134/63  Pulse: 96  Weight: 199 lb (90.3 kg)    Fetal Status: Fetal Heart Rate (bpm): NST   Movement: Present     General:  Alert, oriented and cooperative. Patient is in no acute distress.  Skin: Skin is warm and dry. No rash noted.   Cardiovascular: Normal heart rate noted  Respiratory: Normal respiratory effort, no problems with respiration noted  Abdomen: Soft, gravid, appropriate for gestational age.  Pain/Pressure: Present     Pelvic: Cervical exam deferred        Extremities: Normal range of motion.  Edema: None  Mental Status:  Normal mood and affect. Normal behavior. Normal judgment and thought content.   Assessment and Plan:  Pregnancy: G3P1011 at 3973w1d  1. Supervision of high risk pregnancy, antepartum  2. Gestational diabetes mellitus (GDM) in third trimester controlled on oral hypoglycemic drug  100% of pts fasting glucose values are elevated.  Pt has not changed her meds yet. Did not pick up her new med.  Metformin 500mg  q am and 1000mg  q pm Glyburide 2.5 mg qhs\  NST reviewed and reactive.  3. H/O: C-section Desired TOLAC. VBAC consent prev signed.    Preterm labor symptoms and general obstetric precautions including but not limited to vaginal bleeding, contractions, leaking of fluid and fetal movement were reviewed in detail with the patient. Please refer to After Visit Summary for other counseling recommendations.  Return in about 7 days (around 01/13/2018) for NST/BPP and HOB; [facility] 0830.   Willodean Rosenthalarolyn Harraway-Smith, MD

## 2018-01-06 NOTE — Progress Notes (Signed)

## 2018-01-07 ENCOUNTER — Encounter (HOSPITAL_COMMUNITY): Payer: Self-pay

## 2018-01-07 ENCOUNTER — Inpatient Hospital Stay (HOSPITAL_COMMUNITY)
Admission: AD | Admit: 2018-01-07 | Discharge: 2018-01-07 | Disposition: A | Discharge status: Home / Self Care | Payer: Medicaid Other | Source: Ambulatory Visit | Attending: Obstetrics & Gynecology | Admitting: Obstetrics & Gynecology

## 2018-01-07 DIAGNOSIS — O24415 Gestational diabetes mellitus in pregnancy, controlled by oral hypoglycemic drugs: Secondary | ICD-10-CM | POA: Insufficient documentation

## 2018-01-07 DIAGNOSIS — Z3A34 34 weeks gestation of pregnancy: Secondary | ICD-10-CM | POA: Diagnosis not present

## 2018-01-07 DIAGNOSIS — O26893 Other specified pregnancy related conditions, third trimester: Secondary | ICD-10-CM

## 2018-01-07 DIAGNOSIS — O0990 Supervision of high risk pregnancy, unspecified, unspecified trimester: Secondary | ICD-10-CM

## 2018-01-07 DIAGNOSIS — N898 Other specified noninflammatory disorders of vagina: Secondary | ICD-10-CM

## 2018-01-07 DIAGNOSIS — O34219 Maternal care for unspecified type scar from previous cesarean delivery: Secondary | ICD-10-CM | POA: Insufficient documentation

## 2018-01-07 DIAGNOSIS — Z87891 Personal history of nicotine dependence: Secondary | ICD-10-CM | POA: Diagnosis not present

## 2018-01-07 DIAGNOSIS — L293 Anogenital pruritus, unspecified: Secondary | ICD-10-CM | POA: Diagnosis present

## 2018-01-07 DIAGNOSIS — Z98891 History of uterine scar from previous surgery: Secondary | ICD-10-CM

## 2018-01-07 LAB — URINALYSIS, ROUTINE W REFLEX MICROSCOPIC
BILIRUBIN URINE: NEGATIVE
GLUCOSE, UA: NEGATIVE mg/dL
Hgb urine dipstick: NEGATIVE
Ketones, ur: NEGATIVE mg/dL
LEUKOCYTES UA: NEGATIVE
Nitrite: NEGATIVE
PH: 5 (ref 5.0–8.0)
Protein, ur: 30 mg/dL — AB
SPECIFIC GRAVITY, URINE: 1.024 (ref 1.005–1.030)

## 2018-01-07 LAB — WET PREP, GENITAL
CLUE CELLS WET PREP: NONE SEEN
SPERM: NONE SEEN
TRICH WET PREP: NONE SEEN
Yeast Wet Prep HPF POC: NONE SEEN

## 2018-01-07 LAB — GLUCOSE, CAPILLARY: Glucose-Capillary: 111 mg/dL — ABNORMAL HIGH (ref 65–99)

## 2018-01-07 NOTE — Discharge Instructions (Signed)

## 2018-01-07 NOTE — MAU Provider Note (Signed)
History   G3P1011 @ 34.2 wks in with c/o itchy vaginal discharge that started yesterday. Denies ROM or vag bleeding. When questioned about her Blood sugars pt just huffed at me and said awful...  CSN: [id number]  Arrival date & time 01/07/18  1109   None     Chief Complaint  Patient presents with  . Vaginal Discharge    HPI  Past Medical History:  Diagnosis Date  . Anemia 2012  . Chlamydia   . Depo-Provera contraceptive status 07/10/2012   Received IM x1 on POD#2 07/09/12  . Depo-Provera contraceptive status 07/10/2012   Received IM x1 on POD#2 07/09/12   . Depression 2011   NO COUNSELING OR MEDS  . Family history of cancer   . Genetic testing 12/02/2017   Common Cancers panel (47 genes) @ [facility] - No pathogenic mutations detected  . Gonorrhea   . Heart murmur    SINCE BIRTH  . Infection 2009   CHLAMYDIA  . 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 Infection    YEAST X1  . Lactating mother 07/10/2012  . Miscarriage   . Postpartum anemia 07/10/2012    Past Surgical History:  Procedure Laterality Date  . CESAREAN SECTION  07/07/2012   Procedure: CESAREAN SECTION;  Surgeon: Purcell Nails, MD;  Location: [facility];  Service: Gynecology;  Laterality: N/A;  . WISDOM TOOTH EXTRACTION     AGE 89    Family History  Problem Relation Age of Onset  . Hypertension Mother   . Miscarriages / [place] Mother   . Hypertension Father   . Diabetes Father   . Heart disease Father   . Kidney disease Father        MASS; KIDNEY REMOVED  . Stroke Father   . Colon polyps Father        #/type unknown  . Asthma Sister   . Cancer Paternal Grandmother        colon; deceased 86  . Asthma Brother   . Lupus Sister   . Lupus Cousin   . Cancer Other        paternal half-sister; stomach ca; deceased 35  . Colon polyps Other        paternal half-brother; #/type unknown  . Anesthesia problems Neg Hx   . Hypotension Neg Hx   . Malignant hyperthermia Neg Hx   . Pseudochol deficiency Neg Hx      Social History   Tobacco Use  . Smoking status: Former Smoker    Packs/day: 0.25    Last attempt to quit: 11/02/2011    Years since quitting: 6.1  . Smokeless tobacco: Never Used  Substance Use Topics  . Alcohol use: No  . Drug use: No    OB History    Gravida Para Term Preterm AB Living   3 1 1   1 1    SAB TAB Ectopic Multiple Live Births   1       1      Review of Systems  Constitutional: Negative.   HENT: Negative.   Eyes: Negative.   Respiratory: Negative.   Cardiovascular: Negative.   Gastrointestinal: Negative.   Endocrine: Negative.   Genitourinary: Positive for vaginal discharge.  Musculoskeletal: Negative.   Skin: Negative.   Allergic/Immunologic: Negative.   Neurological: Negative.   Hematological: Negative.   Psychiatric/Behavioral: Negative.     Allergies  Patient has no known allergies.  Home Medications    BP 117/71 (BP Location: Left Arm)   Pulse Marland Kitchen)  102   Temp 98.7 F (37.1 C) (Oral)   Resp 18   Wt 202 lb (91.6 kg)   LMP 05/23/2017   BMI 35.78 kg/m   Physical Exam  Constitutional: She is oriented to person, place, and time. She appears well-developed and well-nourished.  HENT:  Head: Normocephalic.  Eyes: Pupils are equal, round, and reactive to light.  Neck: Normal range of motion.  Cardiovascular: Normal rate, regular rhythm, normal heart sounds and intact distal pulses.  Pulmonary/Chest: Effort normal and breath sounds normal.  Abdominal: Bowel sounds are normal.  Genitourinary: Vaginal discharge found.  Musculoskeletal: Normal range of motion.  Neurological: She is alert and oriented to person, place, and time. She has normal reflexes.  Skin: Skin is warm and dry.  Psychiatric: She has a normal mood and affect. Her behavior is normal. Judgment and thought content normal.    MAU Course  Procedures (including critical care time)  Labs Reviewed  WET PREP, GENITAL  URINALYSIS, ROUTINE W REFLEX MICROSCOPIC  GC/CHLAMYDIA  PROBE AMP ([place]) NOT [facility]   No results found.   1. Vaginal discharge during pregnancy in third trimester   2. Gestational diabetes mellitus (GDM) in third trimester controlled on oral hypoglycemic drug   3. Supervision of high risk pregnancy, antepartum   4. H/O: C-section       MDM  VSS, FHR 130's with accels 15x15, no decels. No uc's. Wet prep neg . Cultures obtained. Will d/c home.

## 2018-01-07 NOTE — MAU Note (Signed)
Pt states she is having vaginal discharge and irritation.

## 2018-01-09 ENCOUNTER — Telehealth: Payer: Self-pay | Admitting: Obstetrics & Gynecology

## 2018-01-09 ENCOUNTER — Encounter: Payer: Self-pay | Admitting: Obstetrics & Gynecology

## 2018-01-09 LAB — GC/CHLAMYDIA PROBE AMP (~~LOC~~) NOT AT ARMC
Chlamydia: NEGATIVE
Neisseria Gonorrhea: NEGATIVE

## 2018-01-09 NOTE — Telephone Encounter (Signed)
Patient called to change her appointment from afternoon, to morning. She stated she could only do morning. I offered her an appointment at 11:15 am to see the provider, and then to come back on 01/13/2018 to get her NST BPP.

## 2018-01-12 ENCOUNTER — Other Ambulatory Visit: Payer: Medicaid Other

## 2018-01-12 ENCOUNTER — Other Ambulatory Visit: Payer: Self-pay | Admitting: *Deleted

## 2018-01-12 ENCOUNTER — Ambulatory Visit (INDEPENDENT_AMBULATORY_CARE_PROVIDER_SITE_OTHER): Payer: Medicaid Other | Admitting: Obstetrics & Gynecology

## 2018-01-12 ENCOUNTER — Encounter: Payer: Medicaid Other | Admitting: Obstetrics & Gynecology

## 2018-01-12 VITALS — BP 131/59 | HR 96

## 2018-01-12 DIAGNOSIS — O24415 Gestational diabetes mellitus in pregnancy, controlled by oral hypoglycemic drugs: Secondary | ICD-10-CM

## 2018-01-12 DIAGNOSIS — O0990 Supervision of high risk pregnancy, unspecified, unspecified trimester: Secondary | ICD-10-CM

## 2018-01-12 NOTE — Progress Notes (Signed)
Pt states strange pains last night, a lot of pressure, felt like baby was moving downward.

## 2018-01-12 NOTE — Patient Instructions (Signed)

## 2018-01-12 NOTE — Progress Notes (Signed)
   PRENATAL VISIT NOTE  Subjective:  Rachel [name] is a 27 y.o. G3P1011 at 6846w0d being seen today for ongoing prenatal care.  She is currently monitored for the following issues for this high-risk pregnancy and has Supervision of high risk pregnancy, antepartum; H/O: C-section; Gestational diabetes mellitus (GDM); Family history of cancer; and Genetic testing on their problem list.  Patient reports no complaints. Sugars have been improving with additional medication, glyburide.  Contractions: Irritability. Vag. Bleeding: None.  Movement: Present. Denies leaking of fluid.   The following portions of the patient's history were reviewed and updated as appropriate: allergies, current medications, past family history, past medical history, past social history, past surgical history and problem list. Problem list updated.  Objective:   Vitals:   01/12/18 1136  BP: (!) 131/59  Pulse: 96    Fetal Status: Fetal Heart Rate (bpm): 134   Movement: Present     General:  Alert, oriented and cooperative. Patient is in no acute distress.  Skin: Skin is warm and dry. No rash noted.   Cardiovascular: Normal heart rate noted  Respiratory: Normal respiratory effort, no problems with respiration noted  Abdomen: Soft, gravid, appropriate for gestational age.  Pain/Pressure: Present     Pelvic: Cervical exam deferred        Extremities: Normal range of motion.  Edema: None  Mental Status:  Normal mood and affect. Normal behavior. Normal judgment and thought content.   Assessment and Plan:  Pregnancy: G3P1011 at 2246w0d  1. Supervision of high risk pregnancy, antepartum - Doing well today without concerns.  - Felt baby move significantly one time, perhaps into cephalic position. Leopold's maneuver conducted today suggests cephalic presentation. - [facility] and [facility]PP.  2. Gestational diabetes mellitus (GDM) in third trimester controlled on oral hypoglycemic drug -  Sugars better controlled today. Currently on Metformin TID (1500 total) and Glyburide qhs.   Preterm labor symptoms and general obstetric precautions including but not limited to vaginal bleeding, contractions, leaking of fluid and fetal movement were reviewed in detail with the patient. Please refer to After Visit Summary for other counseling recommendations.  Return in about 8 days (around 01/20/2018).   Asa SaunasNathan J Hodaya Curto, Student-PA

## 2018-01-13 ENCOUNTER — Ambulatory Visit (INDEPENDENT_AMBULATORY_CARE_PROVIDER_SITE_OTHER): Payer: Medicaid Other | Admitting: *Deleted

## 2018-01-13 ENCOUNTER — Ambulatory Visit: Payer: Self-pay

## 2018-01-13 VITALS — BP 138/62 | HR 89

## 2018-01-13 DIAGNOSIS — O24415 Gestational diabetes mellitus in pregnancy, controlled by oral hypoglycemic drugs: Secondary | ICD-10-CM | POA: Diagnosis not present

## 2018-01-13 NOTE — Progress Notes (Signed)

## 2018-01-16 ENCOUNTER — Telehealth: Payer: Self-pay | Admitting: *Deleted

## 2018-01-16 NOTE — Telephone Encounter (Signed)
Patient called this am and left voice message she recently called her pharmacy and they told her they need her doctor to call about her medicine . Asks for a call back.

## 2018-01-16 NOTE — Telephone Encounter (Signed)
I called Rachel [name] and she states she went to get the metformin and they told her didn't have the order -needed to talk with provider. I called [facility] and they stated she said order was supposed to be tid but the order they have is bid so they cancelled the order. I gave new order to reinstituted old order of metformin bid 500mg  with 2 refills- should be getting 1st refill. I called Ms. Rachel [name] and we discussed originally she was on metformin 500mg  bid , then because fastings were elevated they added glyburide on 12/30/17 which she was not able to get filled until 01/08/18.  There is some notation about her taking metformin 500 im am and 1000mg  in pm but no new orders. She also states that after she started the glyburide her blood sugars dropped a lot and Dr. Debroah LoopArnold told her no changes in her orders. I instructed her she could pick up the metformin today and continue the 500 mg  bid ; along with glyburide at hs. I also instructed her to note on her log what doses she was taking so provider can adjust meds if needed at her next visit 01/19/18 . I also infomred her I will send a message to Dr. Debroah LoopArnold re: this conversation and if he has any changes- we would call her back. She voices understanding.

## 2018-01-19 ENCOUNTER — Ambulatory Visit (HOSPITAL_COMMUNITY): Payer: Medicaid Other

## 2018-01-20 ENCOUNTER — Other Ambulatory Visit (HOSPITAL_COMMUNITY)
Admission: RE | Admit: 2018-01-20 | Discharge: 2018-01-20 | Disposition: A | Discharge status: Home / Self Care | Payer: Medicaid Other | Source: Ambulatory Visit | Attending: Obstetrics & Gynecology | Admitting: Obstetrics & Gynecology

## 2018-01-20 ENCOUNTER — Other Ambulatory Visit: Payer: Self-pay | Admitting: Obstetrics & Gynecology

## 2018-01-20 ENCOUNTER — Ambulatory Visit (INDEPENDENT_AMBULATORY_CARE_PROVIDER_SITE_OTHER): Payer: Medicaid Other | Admitting: *Deleted

## 2018-01-20 ENCOUNTER — Other Ambulatory Visit (HOSPITAL_COMMUNITY): Payer: Self-pay | Admitting: Maternal & Fetal Medicine

## 2018-01-20 ENCOUNTER — Other Ambulatory Visit: Payer: Medicaid Other

## 2018-01-20 ENCOUNTER — Ambulatory Visit (INDEPENDENT_AMBULATORY_CARE_PROVIDER_SITE_OTHER): Payer: Medicaid Other | Admitting: Obstetrics & Gynecology

## 2018-01-20 ENCOUNTER — Ambulatory Visit (HOSPITAL_COMMUNITY)
Admission: RE | Admit: 2018-01-20 | Discharge: 2018-01-20 | Disposition: A | Discharge status: Home / Self Care | Payer: Medicaid Other | Source: Ambulatory Visit | Attending: Obstetrics & Gynecology | Admitting: Obstetrics & Gynecology

## 2018-01-20 ENCOUNTER — Encounter: Payer: Medicaid Other | Admitting: Obstetrics & Gynecology

## 2018-01-20 VITALS — BP 133/58 | HR 92 | Wt 197.1 lb

## 2018-01-20 DIAGNOSIS — O24415 Gestational diabetes mellitus in pregnancy, controlled by oral hypoglycemic drugs: Secondary | ICD-10-CM

## 2018-01-20 DIAGNOSIS — Z3A36 36 weeks gestation of pregnancy: Secondary | ICD-10-CM | POA: Insufficient documentation

## 2018-01-20 DIAGNOSIS — Z3A34 34 weeks gestation of pregnancy: Secondary | ICD-10-CM | POA: Insufficient documentation

## 2018-01-20 DIAGNOSIS — Z98891 History of uterine scar from previous surgery: Secondary | ICD-10-CM

## 2018-01-20 DIAGNOSIS — O0990 Supervision of high risk pregnancy, unspecified, unspecified trimester: Secondary | ICD-10-CM

## 2018-01-20 DIAGNOSIS — O0993 Supervision of high risk pregnancy, unspecified, third trimester: Secondary | ICD-10-CM

## 2018-01-20 DIAGNOSIS — O321XX0 Maternal care for breech presentation, not applicable or unspecified: Secondary | ICD-10-CM

## 2018-01-20 NOTE — Progress Notes (Signed)
   PRENATAL VISIT NOTE  Subjective:  Rachel [name] is a 27 y.o. G3P1011 at 335w1d being seen today for ongoing prenatal care.  She is currently monitored for the following issues for this high-risk pregnancy and has Supervision of high risk pregnancy, antepartum; H/O: C-section; Gestational diabetes mellitus (GDM); Family history of cancer; and Genetic testing on their problem list.  Patient reports no complaints.  Contractions: Not present. Vag. Bleeding: None.  Movement: Present. Denies leaking of fluid.   The following portions of the patient's history were reviewed and updated as appropriate: allergies, current medications, past family history, past medical history, past social history, past surgical history and problem list. Problem list updated.  Objective:   Vitals:   01/20/18 1135  BP: (!) 133/58  Pulse: 92  Weight: 197 lb 1.6 oz (89.4 kg)    Fetal Status: Fetal Heart Rate (bpm): RNST   Movement: Present     General:  Alert, oriented and cooperative. Patient is in no acute distress.  Skin: Skin is warm and dry. No rash noted.   Cardiovascular: Normal heart rate noted  Respiratory: Normal respiratory effort, no problems with respiration noted  Abdomen: Soft, gravid, appropriate for gestational age.  Pain/Pressure: Present     Pelvic: Cervical exam deferred        Extremities: Normal range of motion.     Mental Status:  Normal mood and affect. Normal behavior. Normal judgment and thought content.   Assessment and Plan:  Pregnancy: G3P1011 at 2235w1d  1. Gestational diabetes mellitus (GDM) in third trimester controlled on oral hypoglycemic drug - stop baby asa - her sugars are reasonable, almost all fastings are less than 90 since adding meds at night. Her 2 hours (only a few are recorded) are not perfect, but not bad. 1202-140.  2. Supervision of high risk pregnancy, antepartum  - GC/Chlamydia probe amp ([place])not at [facility] - Strep Gp B NAA  3. H/O:  C-section 4. She has been counseled about version. She does not want this. She is aware that if the baby is still breech at the time of labor or IOL, then she will need a repeat c/s.   Preterm labor symptoms and general obstetric precautions including but not limited to vaginal bleeding, contractions, leaking of fluid and fetal movement were reviewed in detail with the patient. Please refer to After Visit Summary for other counseling recommendations.  Return in about 7 days (around 01/27/2018) for weekly as scheduled.   Allie BossierMyra C Jaiya Mooradian, MD

## 2018-01-22 LAB — STREP GP B NAA: Strep Gp B NAA: NEGATIVE

## 2018-01-23 ENCOUNTER — Encounter: Payer: Self-pay | Admitting: Obstetrics & Gynecology

## 2018-01-23 DIAGNOSIS — O3660X0 Maternal care for excessive fetal growth, unspecified trimester, not applicable or unspecified: Secondary | ICD-10-CM | POA: Insufficient documentation

## 2018-01-23 LAB — GC/CHLAMYDIA PROBE AMP (~~LOC~~) NOT AT ARMC
CHLAMYDIA, DNA PROBE: NEGATIVE
NEISSERIA GONORRHEA: NEGATIVE

## 2018-01-27 ENCOUNTER — Ambulatory Visit: Payer: Self-pay

## 2018-01-27 ENCOUNTER — Encounter (HOSPITAL_COMMUNITY): Payer: Self-pay

## 2018-01-27 ENCOUNTER — Ambulatory Visit (INDEPENDENT_AMBULATORY_CARE_PROVIDER_SITE_OTHER): Payer: Medicaid Other | Admitting: Obstetrics and Gynecology

## 2018-01-27 ENCOUNTER — Encounter: Payer: Self-pay | Admitting: Obstetrics and Gynecology

## 2018-01-27 ENCOUNTER — Ambulatory Visit (INDEPENDENT_AMBULATORY_CARE_PROVIDER_SITE_OTHER): Payer: Medicaid Other | Admitting: *Deleted

## 2018-01-27 VITALS — BP 131/66 | HR 64 | Wt 201.1 lb

## 2018-01-27 DIAGNOSIS — O24415 Gestational diabetes mellitus in pregnancy, controlled by oral hypoglycemic drugs: Secondary | ICD-10-CM

## 2018-01-27 DIAGNOSIS — Z98891 History of uterine scar from previous surgery: Secondary | ICD-10-CM

## 2018-01-27 DIAGNOSIS — O0993 Supervision of high risk pregnancy, unspecified, third trimester: Secondary | ICD-10-CM

## 2018-01-27 DIAGNOSIS — O0990 Supervision of high risk pregnancy, unspecified, unspecified trimester: Secondary | ICD-10-CM

## 2018-01-27 DIAGNOSIS — O321XX0 Maternal care for breech presentation, not applicable or unspecified: Secondary | ICD-10-CM

## 2018-01-27 LAB — POCT URINALYSIS DIP (DEVICE)
Bilirubin Urine: NEGATIVE
GLUCOSE, UA: NEGATIVE mg/dL
KETONES UR: NEGATIVE mg/dL
Leukocytes, UA: NEGATIVE
NITRITE: NEGATIVE
PROTEIN: 30 mg/dL — AB
Specific Gravity, Urine: 1.025 (ref 1.005–1.030)
Urobilinogen, UA: 0.2 mg/dL (ref 0.0–1.0)
pH: 6 (ref 5.0–8.0)

## 2018-01-27 NOTE — Progress Notes (Signed)
Prenatal Visit Note Date: 01/27/2018 Clinic: [facility]  Subjective:  Rachel [name] is a 27 y.o. G3P1011 at 1956w1d being seen today for ongoing prenatal care.  She is currently monitored for the following issues for this high-risk pregnancy and has Supervision of high risk pregnancy, antepartum; H/O: C-section; Gestational diabetes mellitus (GDM); Family history of cancer; Genetic testing; Engagement of fetus in breech position; and LGA (large for gestational age) fetus affecting management of mother on their problem list.  Patient reports no complaints.   Contractions: Irregular. Vag. Bleeding: None.  Movement: Present. Denies leaking of fluid.   The following portions of the patient's history were reviewed and updated as appropriate: allergies, current medications, past family history, past medical history, past social history, past surgical history and problem list. Problem list updated.  Objective:   Vitals:   01/27/18 0836  BP: 131/66  Pulse: 64  Weight: 201 lb 1.6 oz (91.2 kg)    Fetal Status: Fetal Heart Rate (bpm): NST   Movement: Present     General:  Alert, oriented and cooperative. Patient is in no acute distress.  Skin: Skin is warm and dry. No rash noted.   Cardiovascular: Normal heart rate noted  Respiratory: Normal respiratory effort, no problems with respiration noted  Abdomen: Soft, gravid, appropriate for gestational age. Pain/Pressure: Present     Pelvic:  Cervical exam deferred        Extremities: Normal range of motion.  Edema: None  Mental Status: Normal mood and affect. Normal behavior. Normal judgment and thought content.   Urinalysis:      Assessment and Plan:  Pregnancy: G3P1011 at 4056w1d  1. Supervision of high risk pregnancy, antepartum Routine care.   2. Gestational diabetes mellitus (GDM) in third trimester controlled on oral hypoglycemic drug Normal BS values. Continue metformin 500 with breakfast and 500 qhs and  glyburide 2.5 qhs. bpp 10/10 today.  3. H/O: C-section See below  4. Engagement of fetus in breech position, single or unspecified fetus Still breech. Doesn't want ecv. Request sent for 2/28 rpt c-section. Does desire tolac if spontaneously flips  Term labor symptoms and general obstetric precautions including but not limited to vaginal bleeding, contractions, leaking of fluid and fetal movement were reviewed in detail with the patient. Please refer to After Visit Summary for other counseling recommendations.  Return in about 7 days (around 02/03/2018) for weekly as scheduled.   Mark BingPickens, Jamarie Joplin, MD

## 2018-01-27 NOTE — Progress Notes (Signed)

## 2018-01-29 DIAGNOSIS — B1920 Unspecified viral hepatitis C without hepatic coma: Secondary | ICD-10-CM | POA: Insufficient documentation

## 2018-01-29 DIAGNOSIS — Z2839 Other underimmunization status: Secondary | ICD-10-CM

## 2018-01-29 DIAGNOSIS — O9989 Other specified diseases and conditions complicating pregnancy, childbirth and the puerperium: Secondary | ICD-10-CM

## 2018-01-29 DIAGNOSIS — Z283 Underimmunization status: Secondary | ICD-10-CM

## 2018-01-30 ENCOUNTER — Telehealth (HOSPITAL_COMMUNITY): Payer: Self-pay | Admitting: *Deleted

## 2018-01-30 NOTE — Telephone Encounter (Signed)
Preadmission screen  

## 2018-01-31 ENCOUNTER — Telehealth (HOSPITAL_COMMUNITY): Payer: Self-pay | Admitting: *Deleted

## 2018-01-31 NOTE — Telephone Encounter (Signed)
Preadmission screen  

## 2018-02-02 ENCOUNTER — Telehealth (HOSPITAL_COMMUNITY): Payer: Self-pay | Admitting: *Deleted

## 2018-02-02 NOTE — Telephone Encounter (Signed)
Preadmission screen  

## 2018-02-03 ENCOUNTER — Ambulatory Visit (INDEPENDENT_AMBULATORY_CARE_PROVIDER_SITE_OTHER): Payer: Medicaid Other | Admitting: Obstetrics & Gynecology

## 2018-02-03 ENCOUNTER — Telehealth (HOSPITAL_COMMUNITY): Payer: Self-pay | Admitting: *Deleted

## 2018-02-03 ENCOUNTER — Ambulatory Visit: Payer: Self-pay

## 2018-02-03 ENCOUNTER — Ambulatory Visit (INDEPENDENT_AMBULATORY_CARE_PROVIDER_SITE_OTHER): Payer: Medicaid Other | Admitting: *Deleted

## 2018-02-03 ENCOUNTER — Encounter (HOSPITAL_COMMUNITY): Payer: Self-pay

## 2018-02-03 ENCOUNTER — Encounter: Payer: Self-pay | Admitting: Obstetrics & Gynecology

## 2018-02-03 VITALS — BP 144/68 | HR 98 | Wt 201.4 lb

## 2018-02-03 DIAGNOSIS — O0990 Supervision of high risk pregnancy, unspecified, unspecified trimester: Secondary | ICD-10-CM

## 2018-02-03 DIAGNOSIS — O24415 Gestational diabetes mellitus in pregnancy, controlled by oral hypoglycemic drugs: Secondary | ICD-10-CM

## 2018-02-03 DIAGNOSIS — Z98891 History of uterine scar from previous surgery: Secondary | ICD-10-CM

## 2018-02-03 DIAGNOSIS — Z029 Encounter for administrative examinations, unspecified: Secondary | ICD-10-CM

## 2018-02-03 DIAGNOSIS — O0993 Supervision of high risk pregnancy, unspecified, third trimester: Secondary | ICD-10-CM

## 2018-02-03 NOTE — Progress Notes (Signed)
Pt denies H/A or visual disturbances. Repeat C/S scheduled on 3/1

## 2018-02-03 NOTE — Telephone Encounter (Signed)
Preadmission screen  

## 2018-02-03 NOTE — Progress Notes (Signed)
   PRENATAL VISIT NOTE  Subjective:  Rachel [name] is a 27 y.o. G3P1011 at 6759w1d being seen today for ongoing prenatal care.  Rachel [name] for the following issues for this high-risk pregnancy and has Supervision of high risk pregnancy, antepartum; H/O: C-section; Gestational diabetes mellitus (GDM); Family history of cancer; Genetic testing; Engagement of fetus in breech position; LGA (large for gestational age) fetus affecting management of mother; and Rubella non-immune status, antepartum on their problem list.  Patient reports no complaints.  Contractions: Irregular. Vag. Bleeding: None.  Movement: Present. Denies leaking of fluid.   The following portions of the patient's history were reviewed and updated as appropriate: allergies, current medications, past family history, past medical history, past social history, past surgical history and problem list. Problem list updated.  Objective:   Vitals:   02/03/18 0828  BP: (!) 142/62  Pulse: 98  Weight: 201 lb 6.4 oz (91.4 kg)    Fetal Status: Fetal Heart Rate (bpm): NST   Movement: Present     General:  Alert, oriented and cooperative. Patient is in no acute distress.  Skin: Skin is warm and dry. No rash noted.   Cardiovascular: Normal heart rate noted  Respiratory: Normal respiratory effort, no problems with respiration noted  Abdomen: Soft, gravid, appropriate for gestational age.  Pain/Pressure: Present     Pelvic: Cervical exam deferred        Extremities: Normal range of motion.  Edema: None  Mental Status:  Normal mood and affect. Normal behavior. Normal judgment and thought content.   Assessment and Plan:  Pregnancy: G3P1011 at 2959w1d  1. Supervision of high risk pregnancy, antepartum Borderline occasional BP elevation since 25 weeks   2. Gestational diabetes mellitus (GDM) in third trimester controlled on oral hypoglycemic drug States BG control is good  3. H/O: C-section Repeat CS for breech  in 7 days  Term labor symptoms and general obstetric precautions including but not limited to vaginal bleeding, contractions, leaking of fluid and fetal movement were reviewed in detail with the patient. Please refer to After Visit Summary for other counseling recommendations.  Return in about 5 weeks (around 03/10/2018) for PP visit.  C/S scheduled 3/1.   Scheryl DarterJames Arnold, MD

## 2018-02-03 NOTE — Patient Instructions (Signed)
Cesarean Delivery Cesarean birth, or cesarean delivery, is the surgical delivery of a baby through an incision in the abdomen and the uterus. This may be referred to as a C-section. This procedure may be scheduled ahead of time, or it may be done in an emergency situation. Tell a health care provider about:  Any allergies you have.  All medicines you are taking, including vitamins, herbs, eye drops, creams, and over-the-counter medicines.  Any problems you or family members have had with anesthetic medicines.  Any blood disorders you have.  Any surgeries you have had.  Any medical conditions you have.  Whether you or any members of your family have a history of deep vein thrombosis (DVT) or pulmonary embolism (PE). What are the risks? Generally, this is a safe procedure. However, problems may occur, including:  Infection.  Bleeding.  Allergic reactions to medicines.  Damage to other structures or organs.  Blood clots.  Injury to your baby.  What happens before the procedure?  Follow instructions from your health care provider about eating or drinking restrictions.  Follow instructions from your health care provider about bathing before your procedure to help reduce your risk of infection.  If you know that you are going to have a cesarean delivery, do not shave your pubic area. Shaving before the procedure may increase your risk of infection.  Ask your health care provider about: ? Changing or stopping your regular medicines. This is especially important if you are taking diabetes medicines or blood thinners. ? Your pain management plan. This is especially important if you plan to breastfeed your baby. ? How long you will be in the hospital after the procedure. ? Any concerns you may have about receiving blood products if you need them during the procedure. ? Cord blood banking, if you plan to collect your baby's umbilical cord blood.  You may also want to ask your  health care provider: ? Whether you will be able to hold or breastfeed your baby while you are still in the operating room. ? Whether your baby can stay with you immediately after the procedure and during your recovery. ? Whether a family member or a person of your choice can go with you into the operating room and stay with you during the procedure, immediately after the procedure, and during your recovery.  Plan to have someone drive you home when you are discharged from the hospital. What happens during the procedure?  Fetal monitors will be placed on your abdomen to monitor your heart rate and your baby's heart rate.  Depending on the reason for your cesarean delivery, you may have a physical exam or additional testing, such as an ultrasound.  An IV tube will be inserted into one of your veins.  You may have your blood or urine tested.  You will be given antibiotic medicine to help prevent infection.  You may be given a special warming gown to wear to keep your temperature stable.  Hair may be removed from your pubic area.  The skin of your pubic area and lower abdomen will be cleaned with a germ-killing solution (antiseptic).  A catheter may be inserted into your bladder through your urethra. This drains your urine during the procedure.  You may be given one or more of the following: ? A medicine to numb the area (local anesthetic). ? A medicine to make you fall asleep (general anesthetic). ? A medicine (regional anesthetic) that is injected into your back or through a small   thin tube placed in your back (spinal anesthetic or epidural anesthetic). This numbs everything below the injection site and allows you to stay awake during your procedure. If this makes you feel nauseous, tell your health care provider. Medicines will be available to help reduce any nausea you may feel.  An incision will be made in your abdomen, and then in your uterus.  If you are awake during your  procedure, you may feel tugging and pulling in your abdomen, but you should not feel pain. If you feel pain, tell your health care provider immediately.  Your baby will be removed from your uterus. You may feel more pressure or pushing while this happens.  Immediately after birth, your baby will be dried and kept warm. You may be able to hold and breastfeed your baby. The umbilical cord may be clamped and cut during this time.  Your placenta will be removed from your uterus.  Your incisions will be closed with stitches (sutures). Staples, skin glue, or adhesive strips may also be applied to the incision in your abdomen.  Bandages (dressings) will be placed over the incision in your abdomen. The procedure may vary among health care providers and hospitals. What happens after the procedure?  Your blood pressure, heart rate, breathing rate, and blood oxygen level will be monitored often until the medicines you were given have worn off.  You may continue to receive fluids and medicines through an IV tube.  You will have some pain. Medicines will be available to help control your pain.  To help prevent blood clots: ? You may be given medicines. ? You may have to wear compression stockings or devices. ? You will be encouraged to walk around when you are able.  Hospital staff will encourage and support bonding with your baby. Your hospital may allow you and your baby to stay in the same room (rooming in) during your hospital stay to encourage successful breastfeeding.  You may be encouraged to cough and breathe deeply often. This helps to prevent lung problems.  If you have a catheter draining your urine, it will be removed as soon as possible after your procedure. This information is not intended to replace advice given to you by your health care provider. Make sure you discuss any questions you have with your health care provider. Document Released: 11/29/2005 Document Revised: 05/06/2016  Document Reviewed: 09/09/2015 Elsevier Interactive Patient Education  2018 [employer]vier Inc[employer]  

## 2018-02-03 NOTE — Progress Notes (Signed)

## 2018-02-09 ENCOUNTER — Encounter (HOSPITAL_COMMUNITY)
Admission: RE | Admit: 2018-02-09 | Discharge: 2018-02-09 | Disposition: A | Discharge status: Home / Self Care | Payer: Medicaid Other | Source: Ambulatory Visit | Attending: Family Medicine | Admitting: Family Medicine

## 2018-02-09 LAB — CBC
HCT: 35.4 % — ABNORMAL LOW (ref 36.0–46.0)
Hemoglobin: 11.2 g/dL — ABNORMAL LOW (ref 12.0–15.0)
MCH: 26.2 pg (ref 26.0–34.0)
MCHC: 31.6 g/dL (ref 30.0–36.0)
MCV: 82.7 fL (ref 78.0–100.0)
PLATELETS: 206 10*3/uL (ref 150–400)
RBC: 4.28 MIL/uL (ref 3.87–5.11)
RDW: 14.8 % (ref 11.5–15.5)
WBC: 7.6 10*3/uL (ref 4.0–10.5)

## 2018-02-09 LAB — TYPE AND SCREEN
ABO/RH(D): A POS
Antibody Screen: NEGATIVE

## 2018-02-09 NOTE — H&P (Signed)
Rachel [name] is an 27 y.o. G36P1011 female.   Chief Complaint: Previous C-section, breech presentation  HPI: Here at 39 wks for RCS due to breech presentation.  Past Medical History:  Diagnosis Date  . Anemia 2012  . Chlamydia   . Depo-Provera contraceptive status 07/10/2012   Received IM x1 on POD#2 07/09/12  . Depo-Provera contraceptive status 07/10/2012   Received IM x1 on POD#2 07/09/12   . Depression 2011   NO COUNSELING OR MEDS  . Family history of cancer   . Genetic testing 12/02/2017   Common Cancers panel (47 genes) @ [facility] - No pathogenic mutations detected  . Gonorrhea   . Heart murmur    SINCE BIRTH  . Infection 2009   CHLAMYDIA  . 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 Infection    YEAST X1  . Lactating mother 07/10/2012  . Miscarriage   . Postpartum anemia 07/10/2012    Past Surgical History:  Procedure Laterality Date  . CESAREAN SECTION  07/07/2012   Procedure: CESAREAN SECTION;  Surgeon: Purcell Nails, MD;  Location: [facility];  Service: Gynecology;  Laterality: N/A;  . WISDOM TOOTH EXTRACTION     AGE 24    Family History  Problem Relation Age of Onset  . Hypertension Mother   . Miscarriages / [place] Mother   . Hypertension Father   . Diabetes Father   . Heart disease Father   . Kidney disease Father        MASS; KIDNEY REMOVED  . Stroke Father   . Colon polyps Father        #/type unknown  . Asthma Sister   . Cancer Paternal Grandmother        colon; deceased 71  . Asthma Brother   . Lupus Sister   . Lupus Cousin   . Cancer Other        paternal half-sister; stomach ca; deceased 87  . Colon polyps Other        paternal half-brother; #/type unknown  . Anesthesia problems Neg Hx   . Hypotension Neg Hx   . Malignant hyperthermia Neg Hx   . Pseudochol deficiency Neg Hx    Social History:  reports that she quit smoking about 6 years ago. She smoked 0.25 packs per day. she has never used smokeless tobacco. She reports that she does not drink  alcohol or use drugs.  Allergies: No Known Allergies  No medications prior to admission.    A comprehensive review of systems was negative.  Last menstrual period 05/23/2017. General appearance: alert, cooperative and appears stated age Head: Normocephalic, without obvious abnormality, atraumatic Neck: supple, symmetrical, trachea midline Lungs: normal effort Heart: regular rate and rhythm Abdomen: gravid, Non-tender Extremities: Homans sign is negative, no sign of DVT Skin: Skin color, texture, turgor normal. No rashes or lesions Neurologic: Grossly normal  Prenatal Transfer Tool  Maternal Diabetes: Yes:  Diabetes Type:  Insulin/Medication controlled Genetic Screening: Normal Maternal Ultrasounds/Referrals: Normal Fetal Ultrasounds or other Referrals:  None Maternal Substance Abuse:  No Significant Maternal Medications:  Meds include: Other: Glyburide, Metformin Significant Maternal Lab Results: Lab values include: Group B Strep negative  Lab Results  Component Value Date   WBC 8.8 12/30/2017   HGB 11.1 12/30/2017   HCT 34.3 12/30/2017   MCV 84 12/30/2017   PLT 210 12/30/2017   Lab Results  Component Value Date   PREGTESTUR POSITIVE (A) 07/06/2017   Blood type:   A+  Antibody: Negative  Rubella:  equivocal  RPR: Nonreactive (11/01 0000)   HBsAg:   nonreactive  HIV:  Nonreactive  GBS: negative  Pap: 2016, negative    Assessment/Plan Principal Problem:   Engagement of fetus in breech position Active Problems:   H/O: C-section   Gestational diabetes mellitus (GDM)   Rubella non-immune status, antepartum  For ERLTCS Risks include but are not limited to bleeding, infection, injury to surrounding structures, including bowel, bladder and ureters, blood clots, and death.  Likelihood of success is high.    Reva Boresanya S Zidan Helget 02/09/2018, 9:12 AM

## 2018-02-09 NOTE — Patient Instructions (Signed)
Durojayaih-Tanginika Corky SingMuir  02/09/2018   Your procedure is scheduled on:  02/10/2018  [facility] at 1245 PM.  Pick up the phone at the desk and dial [phone]  Call this number if you have problems the morning of surgery:[phone]  Remember:   Do not eat food:(After Midnight) Desps de medianoche.  Do not drink clear liquids: (6 Hours before arrival) 6 horas ante llegada.  Take these medicines the morning of surgery with A SIP OF WATER: take your glyburide at bedtime on 2/28.  DO NOT TAKE ANY METFORMINTHE EVENING OF 2/28 OR DAY OF SURGERY.   Do not wear jewelry, make-up or nail polish.  Do not wear lotions, powders, or perfumes. Do not wear deodorant.  Do not shave 48 hours prior to surgery.  Do not bring valuables to the hospital.  [facility] is not   responsible for any belongings or valuables brought to the hospital.  Contacts, dentures or bridgework may not be worn into surgery.  Leave suitcase in the car. After surgery it may be brought to your room.  For patients admitted to the hospital, checkout time is 11:00 AM the day of              discharge.    N/A   Please read over the following fact sheets that you were given:   Surgical Site Infection Prevention

## 2018-02-10 ENCOUNTER — Inpatient Hospital Stay (HOSPITAL_COMMUNITY)
Admission: AD | Admit: 2018-02-10 | Discharge: 2018-02-13 | DRG: 788 | Disposition: A | Discharge status: Home / Self Care | Payer: Medicaid Other | Source: Ambulatory Visit | Attending: Family Medicine | Admitting: Family Medicine

## 2018-02-10 ENCOUNTER — Encounter: Payer: Medicaid Other | Admitting: Obstetrics and Gynecology

## 2018-02-10 ENCOUNTER — Inpatient Hospital Stay (HOSPITAL_COMMUNITY): Payer: Medicaid Other | Admitting: Anesthesiology

## 2018-02-10 ENCOUNTER — Other Ambulatory Visit: Payer: Self-pay

## 2018-02-10 ENCOUNTER — Other Ambulatory Visit: Payer: Medicaid Other

## 2018-02-10 ENCOUNTER — Encounter (HOSPITAL_COMMUNITY)
Admission: AD | Disposition: A | Discharge status: Home / Self Care | Payer: Self-pay | Source: Ambulatory Visit | Attending: Family Medicine

## 2018-02-10 ENCOUNTER — Encounter (HOSPITAL_COMMUNITY): Payer: Self-pay | Admitting: *Deleted

## 2018-02-10 DIAGNOSIS — O9902 Anemia complicating childbirth: Secondary | ICD-10-CM | POA: Diagnosis present

## 2018-02-10 DIAGNOSIS — O34211 Maternal care for low transverse scar from previous cesarean delivery: Secondary | ICD-10-CM | POA: Diagnosis present

## 2018-02-10 DIAGNOSIS — Z3A39 39 weeks gestation of pregnancy: Secondary | ICD-10-CM

## 2018-02-10 DIAGNOSIS — D649 Anemia, unspecified: Secondary | ICD-10-CM | POA: Diagnosis present

## 2018-02-10 DIAGNOSIS — O09899 Supervision of other high risk pregnancies, unspecified trimester: Secondary | ICD-10-CM

## 2018-02-10 DIAGNOSIS — Z87891 Personal history of nicotine dependence: Secondary | ICD-10-CM | POA: Diagnosis not present

## 2018-02-10 DIAGNOSIS — O24425 Gestational diabetes mellitus in childbirth, controlled by oral hypoglycemic drugs: Secondary | ICD-10-CM | POA: Diagnosis present

## 2018-02-10 DIAGNOSIS — O9989 Other specified diseases and conditions complicating pregnancy, childbirth and the puerperium: Secondary | ICD-10-CM

## 2018-02-10 DIAGNOSIS — O321XX0 Maternal care for breech presentation, not applicable or unspecified: Principal | ICD-10-CM | POA: Diagnosis present

## 2018-02-10 DIAGNOSIS — Z8632 Personal history of gestational diabetes: Secondary | ICD-10-CM | POA: Diagnosis present

## 2018-02-10 DIAGNOSIS — O24429 Gestational diabetes mellitus in childbirth, unspecified control: Secondary | ICD-10-CM | POA: Diagnosis not present

## 2018-02-10 DIAGNOSIS — Z283 Underimmunization status: Secondary | ICD-10-CM

## 2018-02-10 DIAGNOSIS — Z98891 History of uterine scar from previous surgery: Secondary | ICD-10-CM

## 2018-02-10 DIAGNOSIS — O3663X0 Maternal care for excessive fetal growth, third trimester, not applicable or unspecified: Secondary | ICD-10-CM | POA: Diagnosis present

## 2018-02-10 LAB — GLUCOSE, CAPILLARY: GLUCOSE-CAPILLARY: 102 mg/dL — AB (ref 65–99)

## 2018-02-10 LAB — RPR: RPR: NONREACTIVE

## 2018-02-10 SURGERY — Surgical Case
Anesthesia: Spinal

## 2018-02-10 MED ORDER — ACETAMINOPHEN 325 MG PO TABS
650.0000 mg | ORAL_TABLET | ORAL | Status: DC | PRN
  Administered 2018-02-10 – 2018-02-13 (×7): 650 mg via ORAL
  Filled 2018-02-10 (×7): qty 2

## 2018-02-10 MED ORDER — MISOPROSTOL 200 MCG PO TABS
600.0000 ug | ORAL_TABLET | Freq: Once | ORAL | Status: AC
  Administered 2018-02-10: 600 ug via BUCCAL

## 2018-02-10 MED ORDER — SCOPOLAMINE 1 MG/3DAYS TD PT72
MEDICATED_PATCH | TRANSDERMAL | Status: DC | PRN
  Administered 2018-02-10: 1 via TRANSDERMAL

## 2018-02-10 MED ORDER — NALBUPHINE HCL 10 MG/ML IJ SOLN: 5.0000 mg | Freq: Once | INTRAMUSCULAR | Status: DC | PRN

## 2018-02-10 MED ORDER — IBUPROFEN 600 MG PO TABS
600.0000 mg | ORAL_TABLET | Freq: Four times a day (QID) | ORAL | Status: DC
  Administered 2018-02-10 – 2018-02-13 (×11): 600 mg via ORAL
  Filled 2018-02-10 (×11): qty 1

## 2018-02-10 MED ORDER — SOD CITRATE-CITRIC ACID 500-334 MG/5ML PO SOLN
30.0000 mL | Freq: Once | ORAL | Status: AC
  Administered 2018-02-10: 30 mL via ORAL
  Filled 2018-02-10: qty 15

## 2018-02-10 MED ORDER — SIMETHICONE 80 MG PO CHEW
80.0000 mg | CHEWABLE_TABLET | ORAL | Status: DC
  Administered 2018-02-10 – 2018-02-12 (×3): 80 mg via ORAL
  Filled 2018-02-10 (×2): qty 1

## 2018-02-10 MED ORDER — OXYTOCIN 10 UNIT/ML IJ SOLN
INTRAVENOUS | Status: DC | PRN
  Administered 2018-02-10: 40 [IU] via INTRAVENOUS

## 2018-02-10 MED ORDER — DIPHENHYDRAMINE HCL 25 MG PO CAPS
25.0000 mg | ORAL_CAPSULE | ORAL | Status: DC | PRN
  Administered 2018-02-10: 25 mg via ORAL

## 2018-02-10 MED ORDER — NALBUPHINE HCL 10 MG/ML IJ SOLN: 5.0000 mg | INTRAMUSCULAR | Status: DC | PRN

## 2018-02-10 MED ORDER — ONDANSETRON HCL 4 MG/2ML IJ SOLN
INTRAMUSCULAR | Status: DC | PRN
  Administered 2018-02-10: 4 mg via INTRAVENOUS

## 2018-02-10 MED ORDER — OXYTOCIN 40 UNITS IN LACTATED RINGERS INFUSION - SIMPLE MED
2.5000 [IU]/h | INTRAVENOUS | Status: DC
  Administered 2018-02-10: 2.5 [IU]/h via INTRAVENOUS

## 2018-02-10 MED ORDER — LACTATED RINGERS IV SOLN
INTRAVENOUS | Status: DC | PRN
  Administered 2018-02-10 (×3): via INTRAVENOUS

## 2018-02-10 MED ORDER — SODIUM CHLORIDE 0.9 % IR SOLN
Status: DC | PRN
  Administered 2018-02-10: 1

## 2018-02-10 MED ORDER — SOD CITRATE-CITRIC ACID 500-334 MG/5ML PO SOLN
30.0000 mL | ORAL | Status: DC
Start: 2018-02-11 — End: 2018-02-10

## 2018-02-10 MED ORDER — PROMETHAZINE HCL 25 MG/ML IJ SOLN: 6.2500 mg | INTRAMUSCULAR | Status: DC | PRN

## 2018-02-10 MED ORDER — NALBUPHINE HCL 10 MG/ML IJ SOLN
5.0000 mg | INTRAMUSCULAR | Status: DC | PRN
Start: 2018-02-10 — End: 2018-02-14

## 2018-02-10 MED ORDER — OXYTOCIN 40 UNITS IN LACTATED RINGERS INFUSION - SIMPLE MED: 2.5000 [IU]/h | INTRAVENOUS | Status: DC

## 2018-02-10 MED ORDER — BUPIVACAINE HCL (PF) 0.25 % IJ SOLN
INTRAMUSCULAR | Status: DC | PRN
  Administered 2018-02-10: 30 mL

## 2018-02-10 MED ORDER — PRENATAL MULTIVITAMIN CH
1.0000 | ORAL_TABLET | Freq: Every day | ORAL | Status: DC
  Administered 2018-02-11 – 2018-02-13 (×3): 1 via ORAL
  Filled 2018-02-10 (×3): qty 1

## 2018-02-10 MED ORDER — SCOPOLAMINE 1 MG/3DAYS TD PT72: 1.0000 | MEDICATED_PATCH | Freq: Once | TRANSDERMAL | Status: DC

## 2018-02-10 MED ORDER — MEPERIDINE HCL 25 MG/ML IJ SOLN: 6.2500 mg | INTRAMUSCULAR | Status: DC | PRN

## 2018-02-10 MED ORDER — CEFAZOLIN SODIUM-DEXTROSE 2-4 GM/100ML-% IV SOLN: 2.0000 g | INTRAVENOUS | Status: DC

## 2018-02-10 MED ORDER — ONDANSETRON HCL 4 MG/2ML IJ SOLN
INTRAMUSCULAR | Status: AC
  Filled 2018-02-10: qty 2

## 2018-02-10 MED ORDER — OXYCODONE HCL 5 MG PO TABS
5.0000 mg | ORAL_TABLET | ORAL | Status: DC | PRN
  Administered 2018-02-11 – 2018-02-12 (×7): 5 mg via ORAL
  Filled 2018-02-10 (×7): qty 1

## 2018-02-10 MED ORDER — MEASLES, MUMPS & RUBELLA VAC ~~LOC~~ INJ: 0.5000 mL | INJECTION | Freq: Once | SUBCUTANEOUS | Status: DC

## 2018-02-10 MED ORDER — MISOPROSTOL 200 MCG PO TABS
ORAL_TABLET | ORAL | Status: AC
  Filled 2018-02-10: qty 3

## 2018-02-10 MED ORDER — DEXTROSE IN LACTATED RINGERS 5 % IV SOLN
INTRAVENOUS | Status: DC
  Administered 2018-02-11: 05:00:00 via INTRAVENOUS

## 2018-02-10 MED ORDER — NALOXONE HCL 0.4 MG/ML IJ SOLN: 0.4000 mg | INTRAMUSCULAR | Status: DC | PRN

## 2018-02-10 MED ORDER — FENTANYL CITRATE (PF) 100 MCG/2ML IJ SOLN
INTRAMUSCULAR | Status: AC
  Filled 2018-02-10: qty 2

## 2018-02-10 MED ORDER — BUPIVACAINE HCL (PF) 0.25 % IJ SOLN
INTRAMUSCULAR | Status: AC
  Filled 2018-02-10: qty 30

## 2018-02-10 MED ORDER — OXYCODONE HCL 5 MG PO TABS
10.0000 mg | ORAL_TABLET | ORAL | Status: DC | PRN
  Administered 2018-02-12 – 2018-02-13 (×4): 10 mg via ORAL
  Filled 2018-02-10 (×4): qty 2

## 2018-02-10 MED ORDER — SODIUM CHLORIDE 0.9% FLUSH: 3.0000 mL | INTRAVENOUS | Status: DC | PRN

## 2018-02-10 MED ORDER — DIPHENHYDRAMINE HCL 50 MG/ML IJ SOLN: 12.5000 mg | INTRAMUSCULAR | Status: DC | PRN

## 2018-02-10 MED ORDER — ONDANSETRON HCL 4 MG/2ML IJ SOLN: 4.0000 mg | Freq: Three times a day (TID) | INTRAMUSCULAR | Status: DC | PRN

## 2018-02-10 MED ORDER — NALOXONE HCL 4 MG/10ML IJ SOLN: 1.0000 ug/kg/h | INTRAVENOUS | Status: DC | PRN

## 2018-02-10 MED ORDER — SENNOSIDES-DOCUSATE SODIUM 8.6-50 MG PO TABS
2.0000 | ORAL_TABLET | ORAL | Status: DC
  Administered 2018-02-10 – 2018-02-12 (×3): 2 via ORAL
  Filled 2018-02-10 (×3): qty 2

## 2018-02-10 MED ORDER — MORPHINE SULFATE (PF) 0.5 MG/ML IJ SOLN
INTRAMUSCULAR | Status: DC | PRN
  Administered 2018-02-10: .2 mg via INTRATHECAL

## 2018-02-10 MED ORDER — MENTHOL 3 MG MT LOZG: 1.0000 | LOZENGE | OROMUCOSAL | Status: DC | PRN

## 2018-02-10 MED ORDER — LACTATED RINGERS IV SOLN
INTRAVENOUS | Status: DC
  Administered 2018-02-10: 15:00:00 via INTRAVENOUS

## 2018-02-10 MED ORDER — PHENYLEPHRINE 8 MG IN D5W 100 ML (0.08MG/ML) PREMIX OPTIME
INJECTION | INTRAVENOUS | Status: AC
  Filled 2018-02-10: qty 100

## 2018-02-10 MED ORDER — COCONUT OIL OIL
1.0000 "application " | TOPICAL_OIL | Status: DC | PRN
  Filled 2018-02-10: qty 120

## 2018-02-10 MED ORDER — DIPHENHYDRAMINE HCL 25 MG PO CAPS
25.0000 mg | ORAL_CAPSULE | Freq: Four times a day (QID) | ORAL | Status: DC | PRN
  Filled 2018-02-10: qty 1

## 2018-02-10 MED ORDER — LACTATED RINGERS IV SOLN: INTRAVENOUS | Status: DC

## 2018-02-10 MED ORDER — ENOXAPARIN SODIUM 40 MG/0.4ML ~~LOC~~ SOLN
40.0000 mg | SUBCUTANEOUS | Status: DC
  Administered 2018-02-11 – 2018-02-12 (×2): 40 mg via SUBCUTANEOUS
  Filled 2018-02-10 (×4): qty 0.4

## 2018-02-10 MED ORDER — PHENYLEPHRINE 8 MG IN D5W 100 ML (0.08MG/ML) PREMIX OPTIME
INJECTION | INTRAVENOUS | Status: DC | PRN
  Administered 2018-02-10: 60 ug/min via INTRAVENOUS

## 2018-02-10 MED ORDER — BUPIVACAINE IN DEXTROSE 0.75-8.25 % IT SOLN
INTRATHECAL | Status: DC | PRN
  Administered 2018-02-10: 12 mg via INTRATHECAL

## 2018-02-10 MED ORDER — SIMETHICONE 80 MG PO CHEW
80.0000 mg | CHEWABLE_TABLET | ORAL | Status: DC | PRN
Start: 2018-02-10 — End: 2018-02-14

## 2018-02-10 MED ORDER — DIBUCAINE 1 % RE OINT: 1.0000 "application " | TOPICAL_OINTMENT | RECTAL | Status: DC | PRN

## 2018-02-10 MED ORDER — OXYTOCIN 40 UNITS IN LACTATED RINGERS INFUSION - SIMPLE MED
INTRAVENOUS | Status: AC
Start: 2018-02-10 — End: 2018-02-11
  Filled 2018-02-10: qty 1000

## 2018-02-10 MED ORDER — MORPHINE SULFATE (PF) 0.5 MG/ML IJ SOLN
INTRAMUSCULAR | Status: AC
  Filled 2018-02-10: qty 10

## 2018-02-10 MED ORDER — OXYTOCIN 10 UNIT/ML IJ SOLN
INTRAMUSCULAR | Status: AC
  Filled 2018-02-10: qty 4

## 2018-02-10 MED ORDER — ZOLPIDEM TARTRATE 5 MG PO TABS: 5.0000 mg | ORAL_TABLET | Freq: Every evening | ORAL | Status: DC | PRN

## 2018-02-10 MED ORDER — FENTANYL CITRATE (PF) 100 MCG/2ML IJ SOLN
INTRAMUSCULAR | Status: DC | PRN
  Administered 2018-02-10: 10 ug via INTRATHECAL

## 2018-02-10 MED ORDER — WITCH HAZEL-GLYCERIN EX PADS: 1.0000 "application " | MEDICATED_PAD | CUTANEOUS | Status: DC | PRN

## 2018-02-10 MED ORDER — CEFAZOLIN SODIUM-DEXTROSE 2-3 GM-%(50ML) IV SOLR
INTRAVENOUS | Status: DC | PRN
  Administered 2018-02-10: 2 g via INTRAVENOUS

## 2018-02-10 MED ORDER — TETANUS-DIPHTH-ACELL PERTUSSIS 5-2.5-18.5 LF-MCG/0.5 IM SUSP: 0.5000 mL | Freq: Once | INTRAMUSCULAR | Status: DC

## 2018-02-10 MED ORDER — SIMETHICONE 80 MG PO CHEW
80.0000 mg | CHEWABLE_TABLET | Freq: Three times a day (TID) | ORAL | Status: DC
  Administered 2018-02-11 – 2018-02-13 (×8): 80 mg via ORAL
  Filled 2018-02-10 (×10): qty 1

## 2018-02-10 MED ORDER — NALBUPHINE HCL 10 MG/ML IJ SOLN: 5.0000 mg | Freq: Once | INTRAMUSCULAR | Status: DC

## 2018-02-10 MED ORDER — SCOPOLAMINE 1 MG/3DAYS TD PT72
MEDICATED_PATCH | TRANSDERMAL | Status: AC
  Filled 2018-02-10: qty 1

## 2018-02-10 MED ORDER — FENTANYL CITRATE (PF) 100 MCG/2ML IJ SOLN
25.0000 ug | INTRAMUSCULAR | Status: DC | PRN
  Administered 2018-02-10 (×2): 50 ug via INTRAVENOUS

## 2018-02-10 MED ORDER — BUPIVACAINE HCL (PF) 0.25 % IJ SOLN
INTRAMUSCULAR | Status: AC
  Filled 2018-02-10: qty 20

## 2018-02-10 MED ORDER — DEXAMETHASONE SODIUM PHOSPHATE 10 MG/ML IJ SOLN
INTRAMUSCULAR | Status: AC
  Filled 2018-02-10: qty 1

## 2018-02-10 MED ORDER — DEXAMETHASONE SODIUM PHOSPHATE 10 MG/ML IJ SOLN
INTRAMUSCULAR | Status: DC | PRN
  Administered 2018-02-10: 10 mg via INTRAVENOUS

## 2018-02-10 SURGICAL SUPPLY — 37 items
BENZOIN TINCTURE PRP APPL 2/3 (GAUZE/BANDAGES/DRESSINGS) ×3 IMPLANT
CHLORAPREP W/TINT 26ML (MISCELLANEOUS) ×3 IMPLANT
CLAMP CORD UMBIL (MISCELLANEOUS) IMPLANT
CLOSURE STERI STRIP 1/2 X4 (GAUZE/BANDAGES/DRESSINGS) ×3 IMPLANT
CLOTH BEACON ORANGE TIMEOUT ST (SAFETY) ×3 IMPLANT
DRSG OPSITE POSTOP 4X10 (GAUZE/BANDAGES/DRESSINGS) ×3 IMPLANT
ELECT REM PT RETURN 9FT ADLT (ELECTROSURGICAL) ×3
ELECTRODE REM PT RTRN 9FT ADLT (ELECTROSURGICAL) ×1 IMPLANT
EXTENDER TRAXI PANNICULUS (MISCELLANEOUS) ×1 IMPLANT
EXTRACTOR VACUUM M CUP 4 TUBE (SUCTIONS) IMPLANT
EXTRACTOR VACUUM M CUP 4' TUBE (SUCTIONS)
GAUZE SPONGE 4X4 12PLY STRL LF (GAUZE/BANDAGES/DRESSINGS) ×6 IMPLANT
GLOVE BIOGEL PI IND STRL 7.0 (GLOVE) ×2 IMPLANT
GLOVE BIOGEL PI INDICATOR 7.0 (GLOVE) ×4
GLOVE ECLIPSE 7.0 STRL STRAW (GLOVE) ×6 IMPLANT
GOWN STRL REUS W/TWL LRG LVL3 (GOWN DISPOSABLE) ×6 IMPLANT
HOVERMATT SINGLE USE (MISCELLANEOUS) ×3 IMPLANT
KIT ABG SYR 3ML LUER SLIP (SYRINGE) IMPLANT
NEEDLE HYPO 22GX1.5 SAFETY (NEEDLE) ×3 IMPLANT
NEEDLE HYPO 25X5/8 SAFETYGLIDE (NEEDLE) IMPLANT
NS IRRIG 1000ML POUR BTL (IV SOLUTION) ×3 IMPLANT
PACK C SECTION WH (CUSTOM PROCEDURE TRAY) ×3 IMPLANT
PAD ABD 7.5X8 STRL (GAUZE/BANDAGES/DRESSINGS) ×3 IMPLANT
PAD OB MATERNITY 4.3X12.25 (PERSONAL CARE ITEMS) ×3 IMPLANT
PENCIL SMOKE EVAC W/HOLSTER (ELECTROSURGICAL) ×3 IMPLANT
RTRCTR C-SECT PINK 25CM LRG (MISCELLANEOUS) ×3 IMPLANT
SPONGE GAUZE 4X4 12PLY STER LF (GAUZE/BANDAGES/DRESSINGS) ×3 IMPLANT
STRIP CLOSURE SKIN 1/2X4 (GAUZE/BANDAGES/DRESSINGS) ×2 IMPLANT
SUT PLAIN 2 0 XLH (SUTURE) ×3 IMPLANT
SUT VIC AB 0 CTX 36 (SUTURE) ×6
SUT VIC AB 0 CTX36XBRD ANBCTRL (SUTURE) ×3 IMPLANT
SUT VIC AB 2-0 CT1 (SUTURE) ×3 IMPLANT
SUT VIC AB 4-0 KS 27 (SUTURE) ×3 IMPLANT
SYR 30ML LL (SYRINGE) ×3 IMPLANT
TOWEL OR 17X24 6PK STRL BLUE (TOWEL DISPOSABLE) ×3 IMPLANT
TRAXI PANNICULUS EXTENDER (MISCELLANEOUS) ×2
TRAY FOLEY BAG SILVER LF 14FR (SET/KITS/TRAYS/PACK) ×3 IMPLANT

## 2018-02-10 NOTE — Anesthesia Postprocedure Evaluation (Signed)
Anesthesia Post Note  Patient: Scientist, [name]  Procedure(s) Performed: CESAREAN SECTION (N/A )     Patient location during evaluation: PACU Anesthesia Type: Spinal Level of consciousness: awake and alert Pain management: pain level controlled Vital Signs Assessment: post-procedure vital signs reviewed and stable Respiratory status: spontaneous breathing and respiratory function stable Cardiovascular status: blood pressure returned to baseline and stable Postop Assessment: spinal receding Anesthetic complications: no    Last Vitals:  Vitals:   02/10/18 1645 02/10/18 1700  BP: 114/65 113/74  Pulse: (!) 56 62  Resp: 20 15  Temp:    SpO2: 93% 100%    Last Pain:  Vitals:   02/10/18 1605  TempSrc: Oral   Pain Goal:                 Rachel [name]

## 2018-02-10 NOTE — Progress Notes (Signed)
POSTPARTUM PROGRESS NOTE  Post Partum/Operative Day 1 Subjective:  Rachel [name] is a 27 y.o. G3P1011 557w1d s/p rLTCS.  No acute events overnight.  Pt denies problems with ambulating, voiding or po intake.  She denies nausea or vomiting.  Pain is well controlled. Lochia Minimal.   Objective: Blood pressure (!) 126/52, pulse 76, temperature 98.6 F (37 C), temperature source Oral, resp. rate 18, height 5\' 3"  (1.6 m), weight 91.2 kg (201 lb), last menstrual period 05/23/2017, SpO2 100 %.  Physical Exam:  General: alert, cooperative and no distress Lochia:normal flow Chest: no respiratory distress Heart:regular rate, distal pulses intact Abdomen: soft, nontender,  Uterine Fundus: firm, appropriately tender DVT Evaluation: No calf swelling or tenderness Extremities: no edema  Recent Labs    02/09/18 1055 02/11/18 0552  HGB 11.2* 9.4*  HCT 35.4* 28.5*    Assessment/Plan:  ASSESSMENT: Rachel [name] is a 27 y.o. G3P1011 4857w1d s/p rLTCS.  Iron daily for anemia. Cr 0.6 so will give first dose lovenox this am.  Plan for discharge tomorrow   LOS: 1 day   Cassi Jenne MossDO 02/11/2018, 7:27 AM

## 2018-02-10 NOTE — Interval H&P Note (Signed)
History and Physical Interval Note:  02/10/2018 2:04 PM  Rachel [name]  has presented today for surgery, with the diagnosis of RCS due to breech--CBG is 102 today. U/S reveals infant is still breech. Breech  The various methods of treatment have been discussed with the patient and family. After consideration of risks, benefits and other options for treatment, the patient has consented to  Procedure(s): CESAREAN SECTION (N/A) as a surgical intervention .  The patient's history has been reviewed, patient examined, no change in status, stable for surgery.  I have reviewed the patient's chart and labs.  Questions were answered to the patient's satisfaction.     Reva Boresanya S Reed Eifert

## 2018-02-10 NOTE — Op Note (Addendum)
Preoperative Diagnosis:  IUP @ 2054w1d, Previous C-section, Breech presentation, GDM   Postoperative Diagnosis:  Same  Procedure: Repeat low transverse cesarean section  Surgeon: Tinnie Gensanya Pratt, M.D.  Assistant: Caryl AdaJazma Phelps, MD  Findings: Viable female infant, Apgar and weight pending, breech presentation  Estimated blood loss: 1000 cc  Complications: None known  Specimens: Placenta to labor and delivery  Reason for procedure: Briefly, the patient is a 27 y.o. [id number] 3554w1d who presents for RCS due to previous C-section and breech presentation and A2 DM.  Procedure: Patient is a to the OR where spinal analgesia was administered. She was then placed in a supine position with left lateral tilt. She received 2 g of Ancef and SCDs were in place. A timeout was performed. She was prepped and draped in the usual sterile fashion. A Foley catheter was placed in the bladder. A knife was then used to make a Pfannenstiel incision. This incision was carried out to underlying fascia which was divided in the midline with the knife. The incision was extended laterally, sharply.  The rectus was divided in the midline.  The peritoneal cavity was entered bluntly.  Alexis retractor was placed inside the incision.  A knife was used to make a low transverse incision on the uterus. This incision was carried down to the amniotic cavity was entered. Fetus was in breech ppresentation and was brought up out of the incision without difficulty. Cord was clamped x 2 and cut. Infant taken to waiting nurse.  Cord blood was obtained. Placenta was delivered from the uterus.  Uterus was cleaned with dry lap pads. Uterine incision closed with 0 Vicryl suture in a locked running fashion. A second layer of 0 Vicryl in an imbricating fashion was used to achieve hemostasis. Alexis retractor was removed from the abdomen. Peritoneal closure was done with 0 Vicryl suture.  Fascia is closed with 0 Vicryl suture in a running fashion.  Subcutaneous tissue infused with 30cc 0.25% Marcaine.  Subcutaneous closure was performed with 0 plain suture.  Skin closed using 3-0 Vicryl on a Keith needle.  Steri strips applied, followed by pressure dressing.  All instrument, needle and lap counts were correct x 2.  Patient was awake and taken to PACU stable.  Infant was transported to NICU, stable.   Shelbie Proctoranya S PrattMD 02/10/2018 3:34 PM

## 2018-02-10 NOTE — Anesthesia Procedure Notes (Signed)
Spinal  Patient location during procedure: OR Start time: 02/10/2018 2:34 PM End time: 02/10/2018 2:45 PM Staffing Anesthesiologist: Heather RobertsSinger, Jesson Foskey, MD Performed: anesthesiologist  Preanesthetic Checklist Completed: patient identified, surgical consent, pre-op evaluation, timeout performed, IV checked, risks and benefits discussed and monitors and equipment checked Spinal Block Patient position: sitting Prep: DuraPrep Patient monitoring: cardiac monitor, continuous pulse ox and blood pressure Approach: midline Location: L2-3 Injection technique: single-shot Needle Needle type: Pencan  Needle gauge: 24 G Needle length: 9 cm Additional Notes Functioning IV was confirmed and monitors were applied. Sterile prep and drape, including hand hygiene and sterile gloves were used. The patient was positioned and the spine was prepped. The skin was anesthetized with lidocaine.  Free flow of clear CSF was obtained prior to injecting local anesthetic into the CSF.  The spinal needle aspirated freely following injection.  The needle was carefully withdrawn.  The patient tolerated the procedure well.

## 2018-02-10 NOTE — Anesthesia Preprocedure Evaluation (Signed)
Anesthesia Evaluation  Patient identified by MRN, date of birth, ID band Patient awake    Reviewed: Allergy & Precautions, H&P , NPO status , Patient's Chart, lab work & pertinent test results  Airway Mallampati: III  TM Distance: >3 FB Neck ROM: Full    Dental no notable dental hx. (+) Teeth Intact   Pulmonary neg pulmonary ROS, former smoker,    Pulmonary exam normal breath sounds clear to auscultation       Cardiovascular negative cardio ROS Normal cardiovascular exam+ Valvular Problems/Murmurs  Rhythm:Regular Rate:Normal     Neuro/Psych Depression negative neurological ROS     GI/Hepatic negative GI ROS, Neg liver ROS,   Endo/Other  negative endocrine ROS  Renal/GU negative Renal ROS  negative genitourinary   Musculoskeletal negative musculoskeletal ROS (+)   Abdominal (+) + obese,   Peds  Hematology negative hematology ROS (+)   Anesthesia Other Findings   Reproductive/Obstetrics (+) Pregnancy Non-reassuring FHR tracing                             Anesthesia Physical  Anesthesia Plan  ASA: II and emergent  Anesthesia Plan: Spinal   Post-op Pain Management:    Induction:   PONV Risk Score and Plan:   Airway Management Planned: Natural Airway  Additional Equipment:   Intra-op Plan:   Post-operative Plan:   Informed Consent: I have reviewed the patients History and Physical, chart, labs and discussed the procedure including the risks, benefits and alternatives for the proposed anesthesia with the patient or authorized representative who has indicated his/her understanding and acceptance.   Dental advisory given  Plan Discussed with: CRNA, Anesthesiologist and Surgeon  Anesthesia Plan Comments:         Anesthesia Quick Evaluation

## 2018-02-10 NOTE — Transfer of Care (Signed)
Immediate Anesthesia Transfer of Care Note  Patient: Rachel [name]  Procedure(s) Performed: CESAREAN SECTION (N/A )  Patient Location: PACU  Anesthesia Type:Spinal  Level of Consciousness: awake  Airway & Oxygen Therapy: Patient Spontanous Breathing  Post-op Assessment: Report given to RN and Post -op Vital signs reviewed and stable  Post vital signs: stable  Last Vitals:  Vitals:   02/10/18 1335  BP: (!) 131/55  Pulse: 85  Resp: 20  Temp: 36.9 C    Last Pain:  Vitals:   02/10/18 1335  TempSrc: Oral         Complications: No apparent anesthesia complications

## 2018-02-11 ENCOUNTER — Encounter (HOSPITAL_COMMUNITY): Payer: Self-pay | Admitting: Family Medicine

## 2018-02-11 LAB — CBC
HCT: 28.5 % — ABNORMAL LOW (ref 36.0–46.0)
Hemoglobin: 9.4 g/dL — ABNORMAL LOW (ref 12.0–15.0)
MCH: 26.9 pg (ref 26.0–34.0)
MCHC: 33 g/dL (ref 30.0–36.0)
MCV: 81.4 fL (ref 78.0–100.0)
Platelets: 194 10*3/uL (ref 150–400)
RBC: 3.5 MIL/uL — AB (ref 3.87–5.11)
RDW: 14.7 % (ref 11.5–15.5)
WBC: 10.3 10*3/uL (ref 4.0–10.5)

## 2018-02-11 LAB — CREATININE, SERUM: CREATININE: 0.6 mg/dL (ref 0.44–1.00)

## 2018-02-11 LAB — GLUCOSE, CAPILLARY: GLUCOSE-CAPILLARY: 107 mg/dL — AB (ref 65–99)

## 2018-02-11 MED ORDER — FERROUS SULFATE 325 (65 FE) MG PO TABS
325.0000 mg | ORAL_TABLET | Freq: Every day | ORAL | Status: DC
  Administered 2018-02-11 – 2018-02-13 (×3): 325 mg via ORAL
  Filled 2018-02-11 (×3): qty 1

## 2018-02-11 MED ORDER — LACTATED RINGERS IV BOLUS (SEPSIS)
500.0000 mL | Freq: Once | INTRAVENOUS | Status: AC
  Administered 2018-02-11: 500 mL via INTRAVENOUS

## 2018-02-11 NOTE — Progress Notes (Signed)
Pt given MMR VIS. 

## 2018-02-11 NOTE — Lactation Note (Signed)
This note was copied from a baby's chart. Lactation Consultation Note  Patient Name: Rachel [name] RUEAV'WToday's Date: 02/11/2018 Reason for consult: Initial assessment;Term This is mom's second baby and newborn is 5523 hours old.  She states she breastfed her first baby for 4 months but it was a struggle with latch.  Newborn has had a shallow latch per mom but last 2 feedings have been more comfortable.  Baby is currently at breast in football hold.  Good depth and nutritive sucking noted.  Reminded mom to keep baby in close during feeding.  Instructed to feed with cues using good breast massage with feeding.  Discussed milk coming to volume and cluster feeding.  Encouraged to call out for assist/concerns prn.  Maternal Data Has patient been taught Hand Expression?: Yes Does the patient have breastfeeding experience prior to this delivery?: Yes  Feeding Feeding Type: Breast Fed Length of feed: 30 min  LATCH Score Latch: Grasps breast easily, tongue down, lips flanged, rhythmical sucking.  Audible Swallowing: A few with stimulation  Type of Nipple: Everted at rest and after stimulation  Comfort (Breast/Nipple): Soft / non-tender  Hold (Positioning): No assistance needed to correctly position infant at breast.  LATCH Score: 9  Interventions    Lactation Tools Discussed/Used     Consult Status Consult Status: Follow-up Date: 02/12/18 Follow-up type: In-patient    Huston FoleyMOULDEN, Breezie Micucci S 02/11/2018, 2:19 PM

## 2018-02-11 NOTE — Progress Notes (Signed)
Resident on call notified patient passed a large clot with voiding, patient states she has passed clots with every fundal check since delivery, no new orders.

## 2018-02-11 NOTE — Anesthesia Postprocedure Evaluation (Signed)
Anesthesia Post Note  Patient: Scientist, physiologicalDurojayaih-Tanginika Keep  Procedure(s) Performed: CESAREAN SECTION (N/A )     Patient location during evaluation: Mother Baby Anesthesia Type: Spinal Level of consciousness: awake and alert Pain management: pain level controlled Vital Signs Assessment: post-procedure vital signs reviewed and stable Respiratory status: spontaneous breathing Cardiovascular status: stable Postop Assessment: no headache, patient able to bend at knees, no backache, no apparent nausea or vomiting, adequate PO intake and spinal receding Anesthetic complications: no    Last Vitals:  Vitals:   02/11/18 0225 02/11/18 0506  BP:  (!) 126/52  Pulse:  76  Resp:  18  Temp:  37 C  SpO2: 96% 100%    Last Pain:  Vitals:   02/11/18 0506  TempSrc: Oral  PainSc: 4    Pain Goal:                 Salome ArntSterling, Khelani Kops Marie

## 2018-02-12 LAB — CBC
HCT: 30.2 % — ABNORMAL LOW (ref 36.0–46.0)
Hemoglobin: 9.9 g/dL — ABNORMAL LOW (ref 12.0–15.0)
MCH: 27 pg (ref 26.0–34.0)
MCHC: 32.8 g/dL (ref 30.0–36.0)
MCV: 82.5 fL (ref 78.0–100.0)
PLATELETS: 202 10*3/uL (ref 150–400)
RBC: 3.66 MIL/uL — ABNORMAL LOW (ref 3.87–5.11)
RDW: 14.9 % (ref 11.5–15.5)
WBC: 8.7 10*3/uL (ref 4.0–10.5)

## 2018-02-12 LAB — GLUCOSE, CAPILLARY: Glucose-Capillary: 83 mg/dL (ref 65–99)

## 2018-02-12 NOTE — Progress Notes (Signed)
Subjective: Postpartum Day 2: Cesarean Delivery Patient reports tolerating PO, + flatus and no problems voiding.  Incisional pain. ALso having difficulty breastfeeding.   Objective: Vital signs in last 24 hours: Temp:  [98.3 F (36.8 C)-99 F (37.2 C)] 98.3 F (36.8 C) (03/03 0649) Pulse Rate:  [76-85] 80 (03/03 0649) Resp:  [16-18] 16 (03/03 0649) BP: (106-135)/(43-65) 121/65 (03/03 0649) SpO2:  [98 %-100 %] 100 % (03/03 0649)  Physical Exam:  General: alert, cooperative and no distress Lochia: appropriate Uterine Fundus: firm Incision: no significant drainage DVT Evaluation: No evidence of DVT seen on physical exam. No cords or calf tenderness. No significant calf/ankle edema.  Recent Labs    02/11/18 0552 02/12/18 0523  HGB 9.4* 9.9*  HCT 28.5* 30.2*    Assessment/Plan: Status post Cesarean section. Doing well postoperatively.  Continue current care Plan for discharge tomorrow Lactation consultation today AM fasting CBG  Caryl AdaJazma Phelps, DO 02/12/2018, 7:15 AM

## 2018-02-12 NOTE — Lactation Note (Signed)
This note was copied from a baby's chart. Lactation Consultation Note  Patient Name: Rachel [name] Reason for consult: Follow-up assessment  7050 hours old female who is being exclusively BF by her mother, NP called for assistance with latch, but LC was working on another patient at that time. Mom was already done feeding baby when entering the room, she complained of sore nipples. She's only been feeding baby on left breast since the right one has positional stripes and it was too painful to latch. Left breast still looks intact upon examination.  Mom was able to latch baby on deeper with RN assistance and she heard baby swallowing, she stated that feedings at the left breasts were somehow comfortable. Mom asked LC to talk to FOB about the consequences of formula feeding; she did. It seemed like FOB has been trying to get mom to request formula, but mom stated that she wants to exclusively BF.  Baby is at 8% weight loss today, explained to mom the importance of consistent pumping to protect her milk supply, mom was due to pump while LC was in the room, LC got mom pumping, she used some coconut oil on nipples to make pumping session more comfortable, mom will be doing so when she pumps after feeding baby at least 8-12 times/day. Praised mom for her efforts.  Mom is aware of LC services and will call PRN.     LATCH Score (per RN) Latch: Grasps breast easily, tongue down, lips flanged, rhythmical sucking.  Audible Swallowing: A few with stimulation  Type of Nipple: Everted at rest and after stimulation  Comfort (Breast/Nipple): Filling, red/small blisters or bruises, mild/mod discomfort  Hold (Positioning): Assistance needed to correctly position infant at breast and maintain latch.  LATCH Score: 7  Interventions Interventions: Breast feeding basics reviewed;DEBP;Coconut oil;Breast massage;Breast compression;Skin to skin  Lactation Tools  Discussed/Used     Consult Status Consult Status: Follow-up Date: 02/13/18 Follow-up type: In-patient    Floyd Wade Venetia ConstableS Joseph Johns [name], 5:23 PM

## 2018-02-13 LAB — GLUCOSE, CAPILLARY: Glucose-Capillary: 96 mg/dL (ref 65–99)

## 2018-02-13 MED ORDER — IBUPROFEN 600 MG PO TABS: 600.0000 mg | ORAL_TABLET | Freq: Four times a day (QID) | ORAL | 0 refills | Status: DC

## 2018-02-13 MED ORDER — OXYCODONE-ACETAMINOPHEN 5-325 MG PO TABS: 1.0000 | ORAL_TABLET | ORAL | 0 refills | Status: DC | PRN

## 2018-02-13 MED ORDER — MEDROXYPROGESTERONE ACETATE 150 MG/ML IM SUSP
150.0000 mg | Freq: Once | INTRAMUSCULAR | Status: AC
  Administered 2018-02-13: 150 mg via INTRAMUSCULAR
  Filled 2018-02-13: qty 1

## 2018-02-13 NOTE — Lactation Note (Addendum)
This note was copied from a baby's chart. Lactation Consultation Note  Patient Name: Rachel [name] ZOXWR'UToday's Date: 02/13/2018 Reason for consult: Follow-up assessment;Infant weight loss   P2, Baby 70 hours old.  10% weight loss. Baby sucking on pacifier and mother states she did that so mother could take a shower.  Pacifier use not recommended at this time.  Mother has only been breastfeeding on the L side due to R nipple soreness. Mother has coconut oil and comfort gels. She states she is willing to try again on the R side. Baby is eager and cueing.  Reviewed hand expression and helped mother compress and receive drops of colostrum. Latched baby in cross cradle with pillow support. Intermittent sucks and swallows observed.  Mother states latch feels better. Encouraged breastfeeding on both breasts per session and compress when feeding slows. Mom encouraged to feed baby 8-12 times/24 hours and with feeding cues.  Encouraged mother to post pump and give volume pumped to baby to stabilize weight loss.     Maternal Data Has patient been taught Hand Expression?: Yes  Feeding Feeding Type: Breast Fed Length of feed: 10 min  LATCH Score Latch: Grasps breast easily, tongue down, lips flanged, rhythmical sucking.  Audible Swallowing: A few with stimulation  Type of Nipple: Everted at rest and after stimulation  Comfort (Breast/Nipple): Filling, red/small blisters or bruises, mild/mod discomfort  Hold (Positioning): Assistance needed to correctly position infant at breast and maintain latch.  LATCH Score: 7  Interventions Interventions: Assisted with latch;Expressed milk;Coconut oil;Comfort gels  Lactation Tools Discussed/Used     Consult Status Consult Status: Follow-up Date: 02/14/18 Follow-up type: In-patient    Dahlia ByesBerkelhammer, [place] [facility] 02/13/2018, 1:56 PM

## 2018-02-13 NOTE — Discharge Instructions (Signed)

## 2018-02-13 NOTE — Discharge Summary (Addendum)
OB Discharge Summary    Patient Name: Rachel [name] DOB: March 29, 1991 MRN: [id number] Date of admission: 02/10/2018  Delivering MD: Reva BoresPRATT, TANYA S )  Date of discharge: 02/13/2018  Admitting diagnosis: rLTCS due to breech presentation Intrauterine pregnancy: 6616w4d    Secondary diagnosis:  Active Problems:   Patient Active Problem List   Diagnosis Date Noted  . Postpartum care following cesarean delivery 02/10/2018  . Rubella non-immune status, antepartum 01/29/2018  . LGA (large for gestational age) fetus affecting management of mother 01/23/2018  . Engagement of fetus in breech position 01/20/2018  . Genetic testing 12/02/2017  . Family history of cancer   . Gestational diabetes mellitus (GDM) 11/23/2017  . Supervision of high risk pregnancy, antepartum 11/07/2017  . H/O: C-section 11/07/2017   Additional problems:  GDM on metformin, glyburide     Discharge diagnosis: Term Pregnancy Delivered                                                                                                Post partum procedures:None  Complications: None  Hospital course:  Sceduled C/S   27 y.o. yo G3P1011 at 8916w4d was admitted to the hospital 02/10/2018 for scheduled repeat cesarean section with the following indication:Malpresentation (breech). Membrane Rupture Time/Date: 3:08 PM ,02/10/2018   Patient delivered a Viable infant.02/10/2018  Details of operation can be found in separate operative note.  Pateint had an uncomplicated postpartum course.  She is ambulating, tolerating a regular diet, passing flatus, and urinating well. Patient is discharged home in stable condition on  02/13/18         Physical exam  Vitals:   02/12/18 1857 02/13/18 0543  BP: (!) 131/58 (!) 136/57  Pulse: 72 78  Resp: 13 18  Temp: 97.7 F (36.5 C) 98.1 F (36.7 C)  SpO2:      General: alert, cooperative and no distress Lochia: appropriate Uterine Fundus: firm Incision: Dressing is clean, dry, and  intact DVT Evaluation: No evidence of DVT seen on physical exam.  Labs: Results for orders placed or performed during the hospital encounter of 02/10/18 (from the past 24 hour(s))  Glucose, capillary     Status: None   Collection Time: 02/12/18 12:26 PM  Result Value Ref Range   Glucose-Capillary 83 65 - 99 mg/dL     Discharge instruction: per After Visit Summary and "Baby and Me Booklet".  After visit meds:  No Known Allergies  Allergies as of 02/13/2018   No Known Allergies     Medication List    STOP taking these medications   acetaminophen 500 MG tablet Commonly known as:  TYLENOL     TAKE these medications   ibuprofen 600 MG tablet Commonly known as:  ADVIL,MOTRIN Take 1 tablet (600 mg total) by mouth every 6 (six) hours.   oxyCODONE-acetaminophen 5-325 MG tablet Commonly known as:  PERCOCET Take 1-2 tablets by mouth every 4 (four) hours as needed for severe pain.   PREPLUS 27-1 MG Tabs Take 1 tablet by mouth daily.      Diet: routine diet  Activity: Advance as tolerated. Pelvic rest for 6  weeks.   Outpatient follow up:1 week for skin incision check, 4 weeks for postpartum follow up Future Appointments:  Future Appointments  Date Time Provider Department Center  02/24/2018 10:15 AM [facility] NURSE [facility]  03/16/2018 10:55 AM Reva Bores, MD [facility]    Follow up Appt: No Follow-up on file.  Postpartum contraception: Depo Provera  Newborn Data: APGAR (1 MIN): 4   APGAR (5 MINS): 5    Baby Feeding: Breast Disposition:home with mother  Ellwood Dense, DO  02/13/2018  OB FELLOW DISCHARGE ATTESTATION I have seen and examined this patient and agree with above documentation in the resident's note.   Frederik Pear, MD OB Fellow 9:06 AM

## 2018-02-14 ENCOUNTER — Ambulatory Visit: Payer: Self-pay

## 2018-02-14 NOTE — Lactation Note (Signed)
This note was copied from a baby's chart. Lactation Consultation Note  Patient Name: Rachel Bradd CanaryDurojayaih-Tanginika Beaufort RUEAV'WToday's Date: 02/14/2018 Reason for consult: Follow-up assessment;Infant weight loss   Baby 89 hours 11.4% weight loss. Emphasized breastfeeding frequency/pumping plan with mother to help stabilize weight loss. Mother continues to breastfeed on L breast only,  Encouraged breastfeeding on both breasts per feeding to increase volume. Assisted w/ DEBP and mother pumped 17 ml in 15 min. Gave volume back to baby with finger and curved tip syringe.    Plan: Breastfeed on both breasts per feeding at least q 2.4-3 hours. Post pump after and give volume pumped back to baby at next feeding. Hand express with each feeding.    Maternal Data    Feeding Feeding Type: Breast Fed Length of feed: 20 min  LATCH Score Latch: Grasps breast easily, tongue down, lips flanged, rhythmical sucking.  Audible Swallowing: A few with stimulation  Type of Nipple: Everted at rest and after stimulation  Comfort (Breast/Nipple): Soft / non-tender  Hold (Positioning): Assistance needed to correctly position infant at breast and maintain latch.  LATCH Score: 8  Interventions    Lactation Tools Discussed/Used     Consult Status Consult Status: Follow-up Date: 02/15/18 Follow-up type: In-patient    Dahlia ByesBerkelhammer, [facility] 02/14/2018, 8:58 AM

## 2018-02-15 ENCOUNTER — Encounter (HOSPITAL_COMMUNITY): Payer: Self-pay | Admitting: *Deleted

## 2018-02-15 ENCOUNTER — Other Ambulatory Visit: Payer: Self-pay

## 2018-02-15 ENCOUNTER — Inpatient Hospital Stay (HOSPITAL_COMMUNITY)
Admission: AD | Admit: 2018-02-15 | Discharge: 2018-02-15 | Disposition: A | Discharge status: Home / Self Care | Payer: Medicaid Other | Source: Ambulatory Visit | Attending: Obstetrics and Gynecology | Admitting: Obstetrics and Gynecology

## 2018-02-15 DIAGNOSIS — L7682 Other postprocedural complications of skin and subcutaneous tissue: Secondary | ICD-10-CM

## 2018-02-15 DIAGNOSIS — Z8371 Family history of colonic polyps: Secondary | ICD-10-CM | POA: Insufficient documentation

## 2018-02-15 DIAGNOSIS — Z9889 Other specified postprocedural states: Secondary | ICD-10-CM | POA: Insufficient documentation

## 2018-02-15 DIAGNOSIS — R109 Unspecified abdominal pain: Secondary | ICD-10-CM | POA: Insufficient documentation

## 2018-02-15 DIAGNOSIS — G8918 Other acute postprocedural pain: Secondary | ICD-10-CM | POA: Insufficient documentation

## 2018-02-15 DIAGNOSIS — Z87891 Personal history of nicotine dependence: Secondary | ICD-10-CM | POA: Diagnosis not present

## 2018-02-15 NOTE — MAU Note (Signed)
Not in lobby

## 2018-02-15 NOTE — Discharge Instructions (Signed)

## 2018-02-15 NOTE — MAU Note (Signed)
Pt s/p repeat c/s on 3/1.  Pt states she is unsure if the incision is draining, however, she noted increased incisional pain & pulling at site yesterday.  Site not tender to touch, old drainage (not marked) noted on honeycomb dressing.

## 2018-02-15 NOTE — MAU Provider Note (Signed)
History     CSN: [id number]  Arrival date and time: 02/15/18 1427   First Provider Initiated Contact with Patient 02/15/18 1539      Chief Complaint  Patient presents with  . Incisional Pain   HPI Rachel [name] is a 27 y.o. [id number] postpartum from a c/section on 3/1 who presents with pain at her incision site. She states the pain has been ongoing since delivery but today she felt like it was worse. She rates the pain a 6/10 and tried ibuprofen with relief. She has not tried her percocet for the pain because it makes her tired. She denies any bleeding or discharge from her incision.   OB History    Gravida Para Term Preterm AB Living   3 2 2   1 2    SAB TAB Ectopic Multiple Live Births   1       2      Past Medical History:  Diagnosis Date  . Anemia 2012  . Chlamydia   . Depo-Provera contraceptive status 07/10/2012   Received IM x1 on POD#2 07/09/12  . Depo-Provera contraceptive status 07/10/2012   Received IM x1 on POD#2 07/09/12   . Depression 2011   NO COUNSELING OR MEDS  . Family history of cancer   . Genetic testing 12/02/2017   Common Cancers panel (47 genes) @ [facility] - No pathogenic mutations detected  . Gonorrhea   . Heart murmur    SINCE BIRTH  . Infection 2009   CHLAMYDIA  . 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 Infection    YEAST X1  . Lactating mother 07/10/2012  . Miscarriage   . Postpartum anemia 07/10/2012    Past Surgical History:  Procedure Laterality Date  . CESAREAN SECTION  07/07/2012   Procedure: CESAREAN SECTION;  Surgeon: Purcell NailsAngela Y Roberts, MD;  Location: [facility];  Service: Gynecology;  Laterality: N/A;  . CESAREAN SECTION N/A 02/10/2018   Procedure: CESAREAN SECTION;  Surgeon: Reva BoresPratt, Tanya S, MD;  [facility];  Service: Obstetrics;  Laterality: N/A;  . WISDOM TOOTH EXTRACTION     AGE 60    Family History  Problem Relation Age of Onset  . Hypertension Mother   . Miscarriages / [place] Mother   . Hypertension Father   .  Diabetes Father   . Heart disease Father   . Kidney disease Father        MASS; KIDNEY REMOVED  . Stroke Father   . Colon polyps Father        #/type unknown  . Asthma Sister   . Cancer Paternal Grandmother        colon; deceased [id number]  . Asthma Brother   . Lupus Sister   . Lupus Cousin   . Cancer Other        paternal half-sister; stomach ca; deceased [id number]  . Colon polyps Other        paternal half-brother; #/type unknown  . Anesthesia problems Neg Hx   . Hypotension Neg Hx   . Malignant hyperthermia Neg Hx   . Pseudochol deficiency Neg Hx     Social History   Tobacco Use  . Smoking status: Former Smoker    Packs/day: 0.25    Last attempt to quit: 11/02/2011    Years since quitting: 6.2  . Smokeless tobacco: Never Used  Substance Use Topics  . Alcohol use: No  . Drug use: No    Allergies: No Known Allergies  Medications Prior to Admission  Medication Sig Dispense Refill Last Dose  . ibuprofen (ADVIL,MOTRIN) 600 MG tablet Take 1 tablet (600 mg total) by mouth every 6 (six) hours. 30 tablet 0   . oxyCODONE-acetaminophen (PERCOCET) 5-325 MG tablet Take 1-2 tablets by mouth every 4 (four) hours as needed for severe pain. 40 tablet 0   . Prenatal Vit-Fe Fumarate-FA (PREPLUS) 27-1 MG TABS Take 1 tablet by mouth daily. 30 tablet 8 Taking    Review of Systems  Constitutional: Negative.  Negative for fatigue and fever.  HENT: Negative.   Respiratory: Negative.  Negative for shortness of breath.   Cardiovascular: Negative.  Negative for chest pain.  Gastrointestinal: Positive for abdominal pain. Negative for constipation, diarrhea, nausea and vomiting.  Genitourinary: Negative.  Negative for dysuria.  Neurological: Negative.  Negative for dizziness and headaches.   Physical Exam   Blood pressure 137/82, pulse 71, temperature 98.8 F (37.1 C), temperature source Oral, resp. rate 18, height 5\' 3"  (1.6 m), weight 204 lb 12 oz (92.9 kg), SpO2 100 %, unknown if currently  breastfeeding.  Physical Exam  Nursing note and vitals reviewed. Constitutional: She is oriented to person, place, and time. She appears well-developed and well-nourished. No distress.  HENT:  Head: Normocephalic.  Eyes: Pupils are equal, round, and reactive to light.  Cardiovascular: Normal rate, regular rhythm and normal heart sounds.  Respiratory: Effort normal and breath sounds normal. No respiratory distress.  GI: Soft. Bowel sounds are normal. She exhibits no distension. There is no tenderness. There is no guarding.  Neurological: She is alert and oriented to person, place, and time.  Skin: Skin is warm and dry. No erythema.  Incision dry and intact. No drainage. No erythema  Psychiatric: She has a normal mood and affect. Her behavior is normal. Judgment and thought content normal.    MAU Course  Procedures  MDM Honeycomb dressing removed  Assessment and Plan   1. Pain at surgical incision   2. Postpartum state    -Discharge home in stable condition -Encouraged patient to use percocet as needed for pain relief -Infection precautions discussed -Patient advised to [facility] with [facility] as scheduled for postpartum appointment -Patient may return to MAU as needed or if her condition were to change or worsen  Rolm Bookbinder CNM 02/15/2018, 3:44 PM

## 2018-02-16 ENCOUNTER — Ambulatory Visit (HOSPITAL_COMMUNITY): Payer: Medicaid Other

## 2018-02-17 ENCOUNTER — Encounter: Payer: Medicaid Other | Admitting: Obstetrics and Gynecology

## 2018-02-17 ENCOUNTER — Other Ambulatory Visit: Payer: Medicaid Other

## 2018-02-24 ENCOUNTER — Ambulatory Visit (INDEPENDENT_AMBULATORY_CARE_PROVIDER_SITE_OTHER): Payer: Medicaid Other

## 2018-02-24 DIAGNOSIS — IMO0002 Reserved for concepts with insufficient information to code with codable children: Secondary | ICD-10-CM

## 2018-02-24 DIAGNOSIS — Z4800 Encounter for change or removal of nonsurgical wound dressing: Secondary | ICD-10-CM

## 2018-02-24 NOTE — Progress Notes (Signed)
Pt came for wound check, all looks well.Advised if she gets any redness , draining, fever, bad odor to immediately go to MAU. Pt verbally acknowledged.

## 2018-02-24 NOTE — Progress Notes (Signed)
Chart reviewed for nurse visit. Agree with plan of care.   Judeth HornLawrence, Maycie Luera, NP 02/24/2018 11:55 AM

## 2018-03-10 ENCOUNTER — Encounter: Payer: Self-pay | Admitting: *Deleted

## 2018-03-16 ENCOUNTER — Ambulatory Visit: Payer: Medicaid Other | Admitting: Family Medicine

## 2018-03-16 ENCOUNTER — Encounter: Payer: Self-pay | Admitting: Certified Nurse Midwife

## 2018-03-16 ENCOUNTER — Ambulatory Visit (INDEPENDENT_AMBULATORY_CARE_PROVIDER_SITE_OTHER): Payer: Medicaid Other | Admitting: Certified Nurse Midwife

## 2018-03-16 DIAGNOSIS — O9089 Other complications of the puerperium, not elsewhere classified: Secondary | ICD-10-CM

## 2018-03-16 LAB — POCT PREGNANCY, URINE: PREG TEST UR: NEGATIVE

## 2018-03-16 MED ORDER — MISOPROSTOL 200 MCG PO TABS: ORAL_TABLET | ORAL | 0 refills | Status: DC

## 2018-03-16 NOTE — Progress Notes (Signed)
Pt interested in Depo Shot

## 2018-03-16 NOTE — Patient Instructions (Signed)
Misoprostol tablets What is this medicine? MISOPROSTOL (mye soe PROST ole) helps to prevent stomach ulcers in patients who take medicines like ibuprofen and aspirin and who are at high risk of complications from ulcers. This medicine may be used for other purposes; ask your health care provider or pharmacist if you have questions. COMMON BRAND NAME(S): Cytotec What should I tell my health care provider before I take this medicine? They need to know if you have any of these conditions: -Crohn's disease -heart disease -kidney disease -ulcerative colitis -an unusual or allergic reaction to misoprostol, prostaglandins, other medicines, foods, dyes, or preservatives -pregnant or trying to get pregnant -breast-feeding How should I use this medicine? Take this medicine by mouth with a full glass of water. Follow the directions on the prescription label. Take this medicine with food.Take your medicine at regular intervals. Do not take your medicine more often than directed. Talk to your pediatrician regarding the use of this medicine in children. Special care may be needed. Overdosage: If you think you have taken too much of this medicine contact a poison control center or emergency room at once. NOTE: This medicine is only for you. Do not share this medicine with others. What if I miss a dose? If you miss a dose, take it as soon as you can. If it is almost time for your next dose, take only that dose. Do not take double or extra doses. What may interact with this medicine? -antacids This list may not describe all possible interactions. Give your health care provider a list of all the medicines, herbs, non-prescription drugs, or dietary supplements you use. Also tell them if you smoke, drink alcohol, or use illegal drugs. Some items may interact with your medicine. What should I watch for while using this medicine? Do not smoke cigarettes or drink alcohol. These increase irritation to your stomach  and can make it more susceptible to damage from medicine like ibuprofen and aspirin. If you are female, do not use this medicine if you are pregnant. Do not get pregnant while taking this medicine and for at least one month (one full menstrual cycle) after stopping this medicine. If you can become pregnant, use a reliable form of birth control while taking this medicine. Talk to your doctor about birth control options. If you do become pregnant, think you are pregnant, or want to become pregnant, immediately call your doctor for advice. What side effects may I notice from receiving this medicine? Side effects that you should report to your doctor or health care professional as soon as possible: -allergic reactions like skin rash, itching or hives, swelling of the face, lips, or tongue -chest pain -fainting spells -severe diarrhea -sudden shortness of breath -unusual vaginal bleeding, pelvic pain, or cramping Side effects that usually do not require medical attention (report to your doctor or health care professional if they continue or are bothersome): -dizziness -headache -menstrual irregularity, spotting, or cramps -mild diarrhea -nausea -stomach upset or cramps This list may not describe all possible side effects. Call your doctor for medical advice about side effects. You may report side effects to FDA at 1-800-FDA-1088. Where should I keep my medicine? Keep out of the reach of children. Store at room temperature below 25 degrees C (77 degrees F). Keep in a dry place. Protect from moisture. Throw away any unused medicine after the expiration date. NOTE: This sheet is a summary. It may not cover all possible information. If you have questions about this medicine, talk to  your doctor, pharmacist, or health care provider.  2018 Elsevier/Gold Standard (2008-11-12 10:59:53)

## 2018-03-16 NOTE — Progress Notes (Signed)
Subjective:     Rachel [name] is a 27 y.o. female who presents for a postpartum visit. She is 5 weeks postpartum following a low cervical transverse Cesarean section. I have fully reviewed the prenatal and intrapartum course. The delivery was at 39/1 gestational weeks. Outcome: repeat cesarean section, low transverse incision. Anesthesia: spinal. Postpartum course has been uncomplicated for patient. She reports continued vaginal bleeding since surgery. Baby's course has been uncomplicated. Baby is feeding by breast. Bleeding moderate lochia and red. Bowel function is normal. Bladder function is normal. Patient is not sexually active. Contraception method is none. Postpartum depression screening: negative.  The following portions of the patient's history were reviewed and updated as appropriate: past family history, past medical history, past social history, past surgical history and problem list.  Review of Systems Pertinent items noted in HPI and remainder of comprehensive ROS otherwise negative.   Objective:    There were no vitals taken for this visit.  General:  alert, cooperative and no distress   Breasts:  inspection negative, no nipple discharge or bleeding, no masses or nodularity palpable  Lungs: clear to auscultation bilaterally  Heart:  regular rate and rhythm and S1, S2 normal  Abdomen: soft, non-tender; bowel sounds normal; no masses,  no organomegaly    Vulva:  normal  Vagina: normal vagina, no discharge, exudate, lesion, or erythema  Cervix:  retroverted  Corpus: uterus is enlarged, U/2  Adnexa:  not evaluated  Rectal Exam: Not performed.        Assessment/Plan:   1. Postpartum care and examination -Abnormal postpartum exam. Pap smear not done at today's visit.   2. Postpartum subinvolution of uterus -Subinvolution present at 5 weeks PP. Discussed with Dr Debroah LoopArnold findings- recommends cytotec plus follow up in 1 week.  -Patient reports continued cramping and  vaginal bleeding- reports changing pads multiple times yesterday and having bright red bleeding.  - misoprostol (CYTOTEC) 200 MCG tablet; Place four tablets in between your gums and cheeks (two tablets on each side).  Dispense: 4 tablet; Refill: 0   Follow up in 1 week for bleeding and depo injection   Rachel [name], CNM 03/16/18, 4:45 PM

## 2018-03-21 ENCOUNTER — Ambulatory Visit: Payer: Medicaid Other | Admitting: Certified Nurse Midwife

## 2018-03-29 ENCOUNTER — Ambulatory Visit (INDEPENDENT_AMBULATORY_CARE_PROVIDER_SITE_OTHER): Payer: Medicaid Other | Admitting: Family Medicine

## 2018-03-29 ENCOUNTER — Encounter: Payer: Self-pay | Admitting: Family Medicine

## 2018-03-29 DIAGNOSIS — Z8632 Personal history of gestational diabetes: Secondary | ICD-10-CM

## 2018-03-29 NOTE — Progress Notes (Signed)
   Subjective:    Patient ID: Rachel [name] is a 27 y.o. female presenting with Post-op Follow-up  on 03/29/2018  HPI: Reports that she is still bleeding. Notes her incision is still hurting. She is also wanting a note for work. She is continuing to have bleeding. She was noted to have a non-involuted uterus last visit. She is on Depo, which she received prior to hospital discharge.   Review of Systems  Constitutional: Negative for chills and fever.  Respiratory: Negative for shortness of breath.   Cardiovascular: Negative for chest pain.  Gastrointestinal: Negative for abdominal pain, nausea and vomiting.  Genitourinary: Negative for dysuria.  Skin: Negative for rash.      Objective:    BP 127/71   Pulse 73   Ht 5\' 3"  (1.6 m)   Wt 179 lb 9.6 oz (81.5 kg)   BMI 31.81 kg/m  Physical Exam  Constitutional: She is oriented to person, place, and time. She appears well-developed and well-nourished. No distress.  HENT:  Head: Normocephalic and atraumatic.  Eyes: No scleral icterus.  Neck: Neck supple.  Cardiovascular: Normal rate.  Pulmonary/Chest: Effort normal.  Abdominal: Soft.  Neurological: She is alert and oriented to person, place, and time.  Skin: Skin is warm and dry.  Psychiatric: She has a normal mood and affect.        Assessment & Plan:   Problem List Items Addressed This Visit      Unprioritized   History of gestational diabetes    Other Visit Diagnoses    Abnormal uterine bleeding, postpartum    -  Primary    related to Depo--check labs, ensure not anemic and no BHCG around   Relevant Orders   CBC (Completed)   Beta hCG quant (ref lab) (Completed)   US PELVIS (TRANSABDOMINAL ONLY)   US PELVIS TRANSVANGINAL NON-OB (TV ONLY)      Total face-to-face time with patient: 15 minutes. Over 50% of encounter was spent on counseling and coordination of care. Return in about 1 month (around 04/26/2018).  Reva Boresanya S Macalister Arnaud 03/29/2018 1:22 PM

## 2018-03-29 NOTE — Patient Instructions (Signed)

## 2018-03-30 LAB — BETA HCG QUANT (REF LAB): hCG Quant: <1 m[IU]/mL

## 2018-03-30 LAB — CBC
HEMATOCRIT: 41.8 % (ref 34.0–46.6)
Hemoglobin: 13.8 g/dL (ref 11.1–15.9)
MCH: 26 pg — AB (ref 26.6–33.0)
MCHC: 33 g/dL (ref 31.5–35.7)
MCV: 79 fL (ref 79–97)
Platelets: 314 10*3/uL (ref 150–379)
RBC: 5.3 x10E6/uL — ABNORMAL HIGH (ref 3.77–5.28)
RDW: 16.3 % — AB (ref 12.3–15.4)
WBC: 8.3 10*3/uL (ref 3.4–10.8)

## 2018-04-04 ENCOUNTER — Ambulatory Visit (HOSPITAL_COMMUNITY)
Admission: RE | Admit: 2018-04-04 | Discharge: 2018-04-04 | Disposition: A | Discharge status: Home / Self Care | Payer: Medicaid Other | Source: Ambulatory Visit | Attending: Family Medicine | Admitting: Family Medicine

## 2018-04-04 ENCOUNTER — Telehealth: Payer: Self-pay | Admitting: General Practice

## 2018-04-04 NOTE — Telephone Encounter (Signed)
Called patient to scheduled 2hr postpartum.  Left message for patient to give our office a call to schedule.

## 2018-04-05 ENCOUNTER — Telehealth: Payer: Self-pay | Admitting: General Practice

## 2018-04-05 NOTE — Telephone Encounter (Signed)
Called patient & informed her of normal ultrasound results. Also discussed need to schedule 2 hr gtt. Patient verbalized understanding to all & states she can come Monday 4/29 @ 930. Patient had no questions

## 2018-04-05 NOTE — Telephone Encounter (Signed)
-----   Message from Arne ClevelandMandy J Hutchinson, [place]MA sent at 04/04/2018  5:35 PM EDT -----   ----- Message ----- From: Reva BoresPratt, Tanya S, MD Sent: 04/04/2018   5:23 PM To: Lesle Chriswh [place] [facility]  Her u/s is mostly normal.

## 2018-04-10 ENCOUNTER — Other Ambulatory Visit: Payer: Medicaid Other

## 2018-04-17 ENCOUNTER — Ambulatory Visit: Payer: Medicaid Other | Admitting: Family Medicine

## 2018-04-21 ENCOUNTER — Ambulatory Visit: Payer: Medicaid Other | Admitting: Certified Nurse Midwife

## 2018-04-27 ENCOUNTER — Ambulatory Visit: Payer: Medicaid Other | Admitting: Family Medicine

## 2018-04-27 ENCOUNTER — Encounter: Payer: Self-pay | Admitting: General Practice

## 2018-04-27 NOTE — Progress Notes (Unsigned)
Patient no showed for appt- per Dr Pratt, patient can reschedule on her own.  

## 2018-07-24 ENCOUNTER — Ambulatory Visit (INDEPENDENT_AMBULATORY_CARE_PROVIDER_SITE_OTHER): Payer: Medicaid Other | Admitting: Family Medicine

## 2018-07-24 ENCOUNTER — Encounter: Payer: Self-pay | Admitting: Family Medicine

## 2018-07-24 ENCOUNTER — Emergency Department (HOSPITAL_COMMUNITY)
Admission: EM | Admit: 2018-07-24 | Discharge: 2018-07-24 | Disposition: A | Discharge status: Home / Self Care | Payer: Medicaid Other | Attending: Emergency Medicine | Admitting: Emergency Medicine

## 2018-07-24 ENCOUNTER — Encounter (HOSPITAL_COMMUNITY): Payer: Self-pay | Admitting: Emergency Medicine

## 2018-07-24 DIAGNOSIS — Y999 Unspecified external cause status: Secondary | ICD-10-CM | POA: Insufficient documentation

## 2018-07-24 DIAGNOSIS — S60453A Superficial foreign body of left middle finger, initial encounter: Secondary | ICD-10-CM | POA: Insufficient documentation

## 2018-07-24 DIAGNOSIS — Z79899 Other long term (current) drug therapy: Secondary | ICD-10-CM | POA: Insufficient documentation

## 2018-07-24 DIAGNOSIS — Z309 Encounter for contraceptive management, unspecified: Secondary | ICD-10-CM | POA: Diagnosis present

## 2018-07-24 DIAGNOSIS — Z87891 Personal history of nicotine dependence: Secondary | ICD-10-CM | POA: Insufficient documentation

## 2018-07-24 DIAGNOSIS — Y92003 Bedroom of unspecified non-institutional (private) residence as the place of occurrence of the external cause: Secondary | ICD-10-CM | POA: Insufficient documentation

## 2018-07-24 DIAGNOSIS — Y9389 Activity, other specified: Secondary | ICD-10-CM | POA: Insufficient documentation

## 2018-07-24 DIAGNOSIS — W4904XA Ring or other jewelry causing external constriction, initial encounter: Secondary | ICD-10-CM | POA: Insufficient documentation

## 2018-07-24 MED ORDER — NORETHINDRONE 0.35 MG PO TABS: 1.0000 | ORAL_TABLET | Freq: Every day | ORAL | 11 refills | Status: DC

## 2018-07-24 NOTE — ED Triage Notes (Signed)
Patient here from home with complaints of finger pain due to ring being stuck on left middle finger.

## 2018-07-24 NOTE — ED Provider Notes (Signed)
[facility]EM[facility]Y DEPT Provider Note: Lowella DellJ. Lane Margrette Wynia, MD, FACEP  CSN: [id number] MRN: [id number] ARRIVAL: 07/24/18 at 0523 ROOM: Room/bed info not found   CHIEF COMPLAINT  Ring Stuck on Finger   HISTORY OF PRESENT ILLNESS  07/24/18 5:40 AM Rachel [name] is a 27 y.o. female with a ring on her left middle finger.  She went to bed with the ring not causing her any difficulty yesterday evening.  She woke this morning with edema at the PIP joint preventing removal of the ring.  She is having moderate discomfort.  She has been unable to remove the ring on her own.   Past Medical History:  Diagnosis Date  . Anemia 2012  . Chlamydia   . Depo-Provera contraceptive status 07/10/2012   Received IM x1 on POD#2 07/09/12  . Depo-Provera contraceptive status 07/10/2012   Received IM x1 on POD#2 07/09/12   . Depression 2011   NO COUNSELING OR MEDS  . Family history of cancer   . Genetic testing 12/02/2017   Common Cancers panel (47 genes) @ [facility] - No pathogenic mutations detected  . Gonorrhea   . Heart murmur    SINCE BIRTH  . Infection 2009   CHLAMYDIA  . 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 Infection    YEAST X1  . Lactating mother 07/10/2012  . Miscarriage   . Postpartum anemia 07/10/2012    Past Surgical History:  Procedure Laterality Date  . CESAREAN SECTION  07/07/2012   Procedure: CESAREAN SECTION;  Surgeon: Purcell NailsAngela Y Roberts, MD;  Location: [facility];  Service: Gynecology;  Laterality: N/A;  . CESAREAN SECTION N/A 02/10/2018   Procedure: CESAREAN SECTION;  Surgeon: Reva BoresPratt, Tanya S, MD;  [facility];  Service: Obstetrics;  Laterality: N/A;  . WISDOM TOOTH EXTRACTION     AGE 27    Family History  Problem Relation Age of Onset  . Hypertension Mother   . Miscarriages / [place] Mother   . Hypertension Father   . Diabetes Father   . Heart disease Father   . Kidney disease Father        MASS; KIDNEY REMOVED  . Stroke Father   . Colon polyps Father        #/type  unknown  . Asthma Sister   . Cancer Paternal Grandmother        colon; deceased 1935  . Asthma Brother   . Lupus Sister   . Lupus Cousin   . Cancer Other        paternal half-sister; stomach ca; deceased [id number]  . Colon polyps Other        paternal half-brother; #/type unknown  . Anesthesia problems Neg Hx   . Hypotension Neg Hx   . Malignant hyperthermia Neg Hx   . Pseudochol deficiency Neg Hx     Social History   Tobacco Use  . Smoking status: Former Smoker    Packs/day: 0.25    Last attempt to quit: 11/02/2011    Years since quitting: 6.7  . Smokeless tobacco: Never Used  Substance Use Topics  . Alcohol use: No  . Drug use: No    Prior to Admission medications   Medication Sig Start Date End Date Taking? Authorizing Provider  ibuprofen (ADVIL,MOTRIN) 600 MG tablet Take 1 tablet (600 mg total) by mouth every 6 (six) hours. 02/13/18   Ellwood Denseumball, Alison, DO    Allergies Patient has no known allergies.   REVIEW OF SYSTEMS  Negative except as noted here or  in the History of Present Illness.   PHYSICAL EXAMINATION  Initial Vital Signs Pulse (P) 60, temperature (P) 98.2 F (36.8 C), temperature source (P) Oral, resp. rate (P) 15, SpO2 (P) 100 %, currently breastfeeding.  Examination General: Well-developed, well-nourished female in no acute distress; appearance consistent with age of record HENT: normocephalic; atraumatic Eyes: Normal appearance Neck: supple Heart: regular rate and rhythm Lungs: clear to auscultation bilaterally Abdomen: soft; nondistended Extremities: No deformity; full range of motion; edema of left middle finger prohibiting removal of ring on the proximal phalanx Neurologic: Awake, alert and oriented; motor function intact in all extremities and symmetric; no facial droop Skin: Warm and dry Psychiatric: Normal mood and affect   RESULTS  Summary of this visit's results, reviewed by myself:   EKG Interpretation  Date/Time:    Ventricular Rate:     PR Interval:    QRS Duration:   QT Interval:    QTC Calculation:   R Axis:     Text Interpretation:        Laboratory Studies: No results found for this or any previous visit (from the past 24 hour(s)). Imaging Studies: No results found.  ED COURSE and MDM  Nursing notes and initial vitals signs, including pulse oximetry, reviewed.  Vitals:   07/24/18 0534  Pulse: (P) 60  Resp: (P) 15  Temp: (P) 98.2 F (36.8 C)  TempSrc: (P) Oral  SpO2: (P) 100%    PROCEDURES   Ring cut in two and removed using a ring cutter lubricated with surgical lube.  Patient tolerated this well.  This did result in a superficial abrasion to her finger.  ED DIAGNOSES     ICD-10-CM   1. Foreign body of left middle finger S60.453A        Chadwick Reiswig, Jonny RuizJohn, MD 07/24/18 [phone]

## 2018-07-24 NOTE — Progress Notes (Signed)
   Subjective:    Patient ID: Rachel [name], female    DOB: 1991/10/16, 27 y.o.   MRN: [id number]  HPI  Had baby in march. Received depo in hospital before she left. COntinue to have bleeding during postpartum period. Stopped in April, went to spotting. Had period second week in June. Lasted for about 3 weeks. Since July, spotting every day. For past week, no bleeding.  Has always had heavy period.  Review of Systems     Objective:   Physical Exam  Constitutional: She is oriented to person, place, and time. She appears well-developed and well-nourished.  Cardiovascular: Normal rate and regular rhythm.  Pulmonary/Chest: Effort normal and breath sounds normal.  Abdominal: She exhibits no distension. There is no tenderness.  Musculoskeletal: Normal range of motion.  Neurological: She is alert and oriented to person, place, and time.  Skin: Skin is warm and dry.  Psychiatric: She has a normal mood and affect. Her behavior is normal. Thought content normal.      Assessment & Plan:  1. Abnormal uterine bleeding, postpartum Sounds like heavy bleeding episode in June was due to depo wearing off. Bleeding has stopped now. Patient would like to change to micronor. Will start. Discussed use and to not use as only contraception for first week.

## 2018-07-24 NOTE — Progress Notes (Signed)
Pt states has been having bleeding on & off, baby is 535 mnths old has been on Depo shots, has had heavy bleeding that lasted 3 to 4 weeks straight but currently in the last 2 to 3 days no bleeding.

## 2018-07-24 NOTE — ED Notes (Signed)
Dr. Molpus at bedside. 

## 2018-07-24 NOTE — ED Notes (Signed)
Ring cutter brought in.

## 2018-07-24 NOTE — ED Notes (Signed)
Bed: [facility] Expected date:  Expected time:  Means of arrival:  Comments: 

## 2018-11-02 IMAGING — US US FETAL BPP W/ NON-STRESS
1 series · 13 of 13 positions shown · non-contrast
Comparison: none

[Series 1: us fetal bpp w/nonstress · 13 acquisitions, 13 frames shown]
[im 1/13]
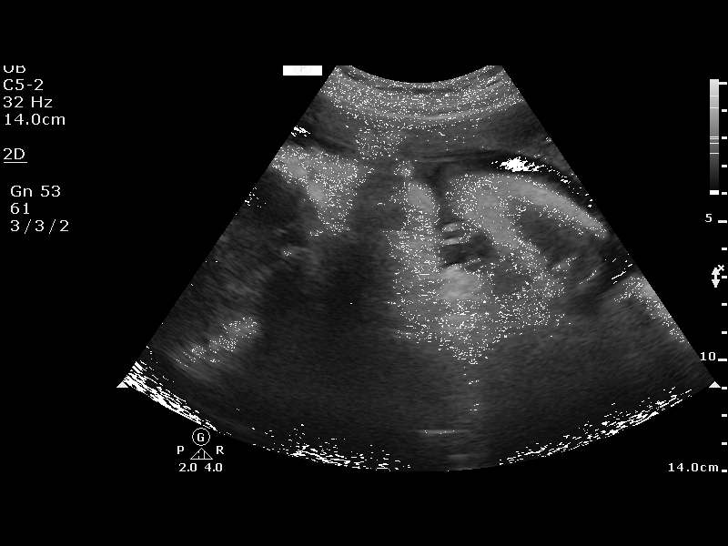
[im 2/13]
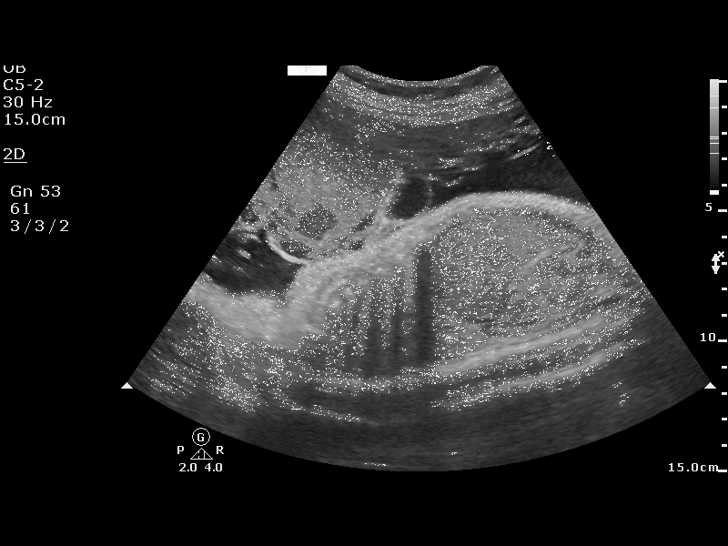
[im 3/13]
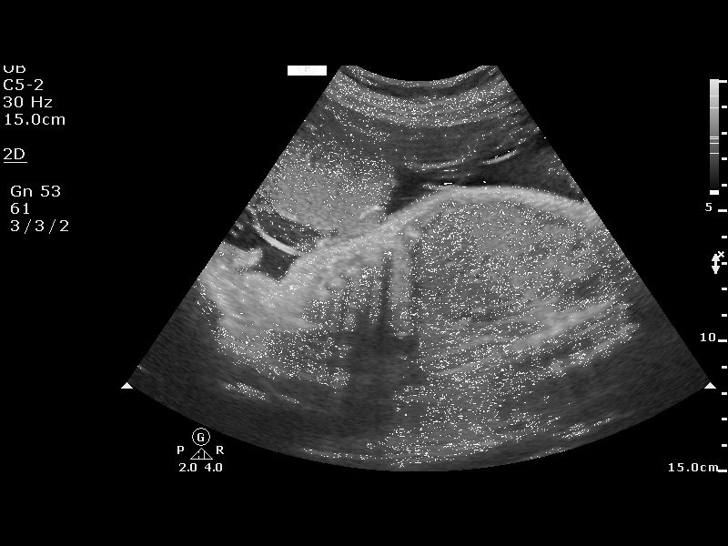
[im 4/13]
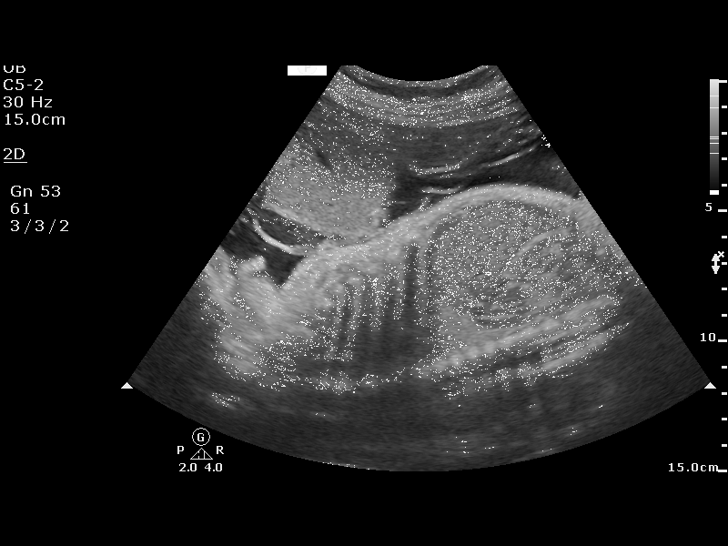
[im 5/13]
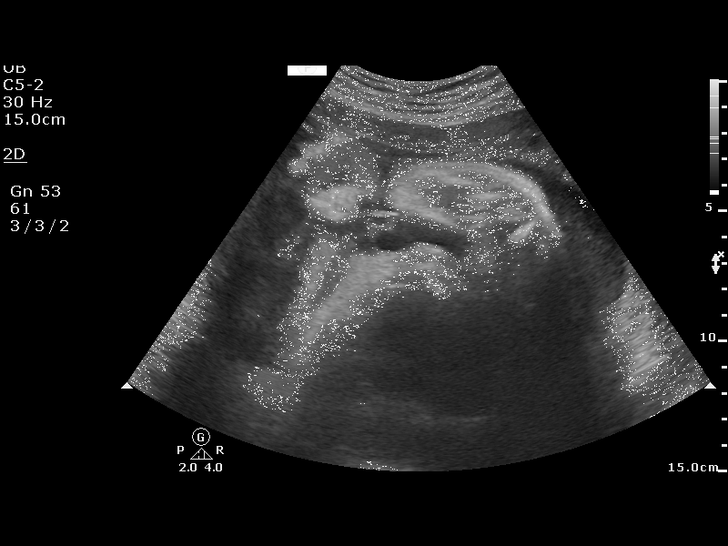
[im 6/13]
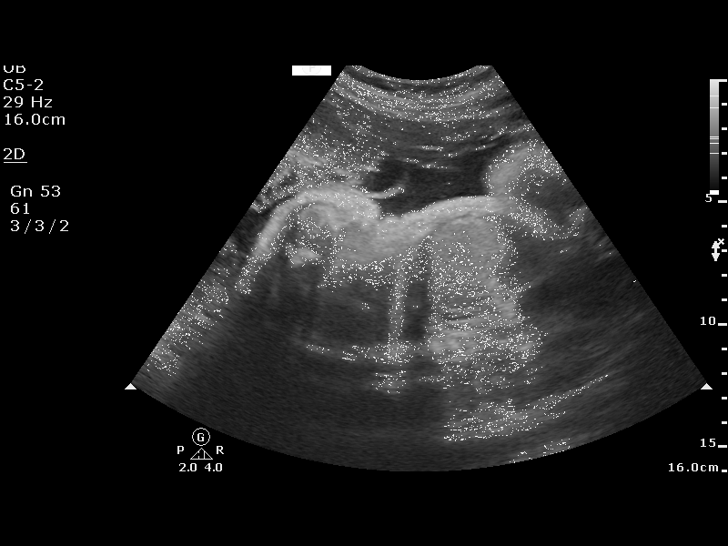
[im 7/13]
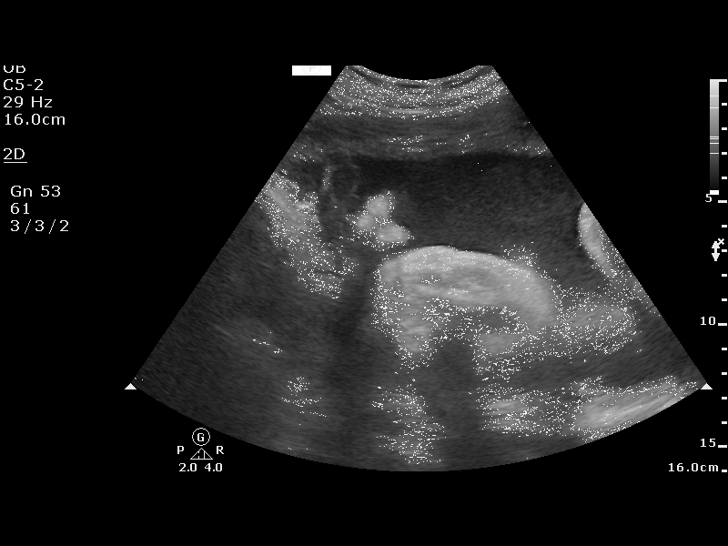
[im 8/13]
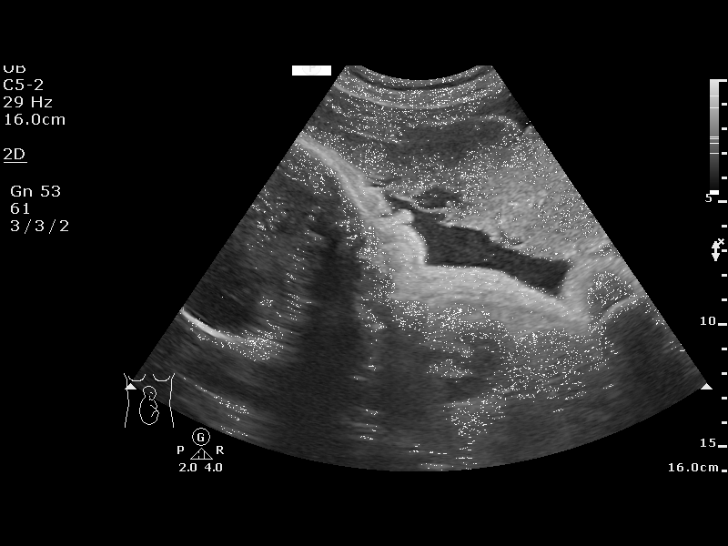
[im 9/13]
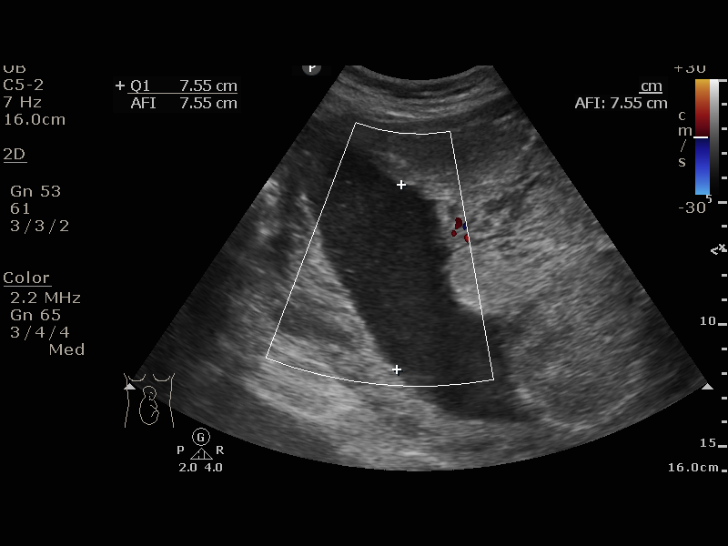
[im 10/13]
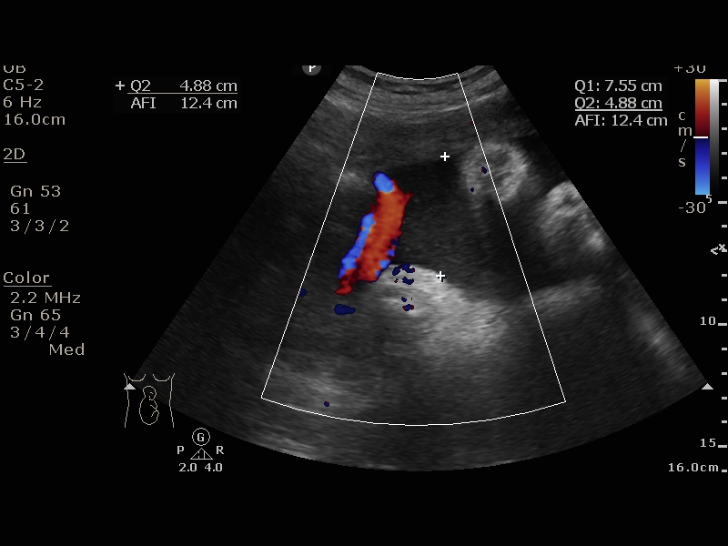
[im 11/13]
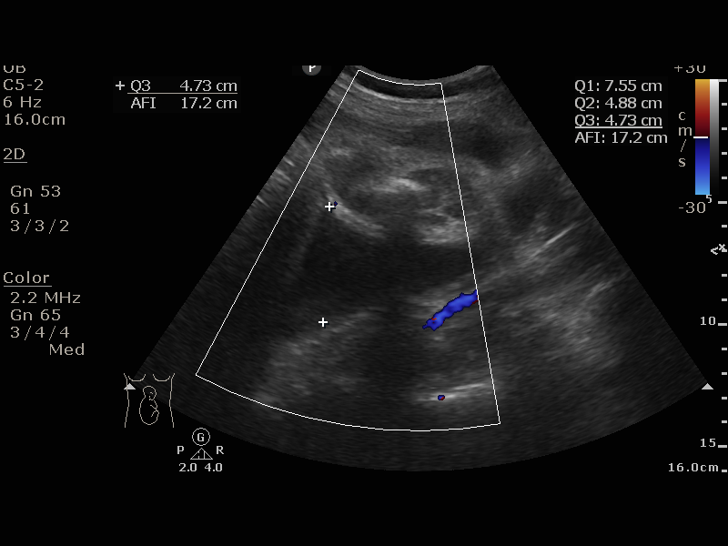
[im 12/13]
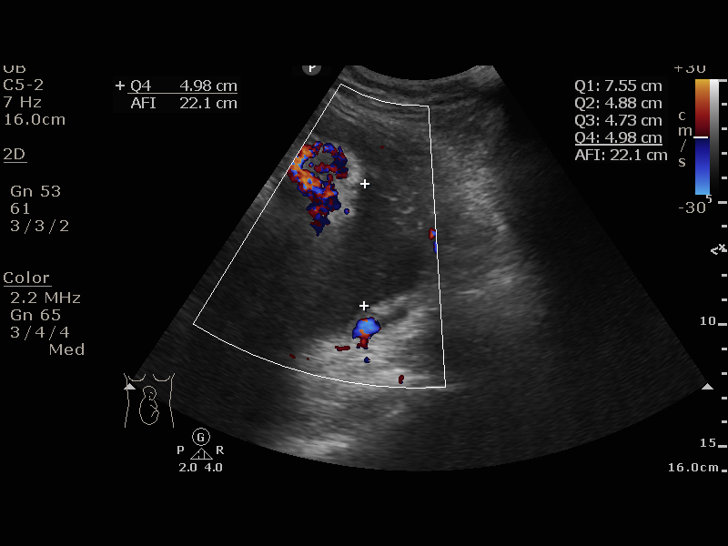
[im 13/13]
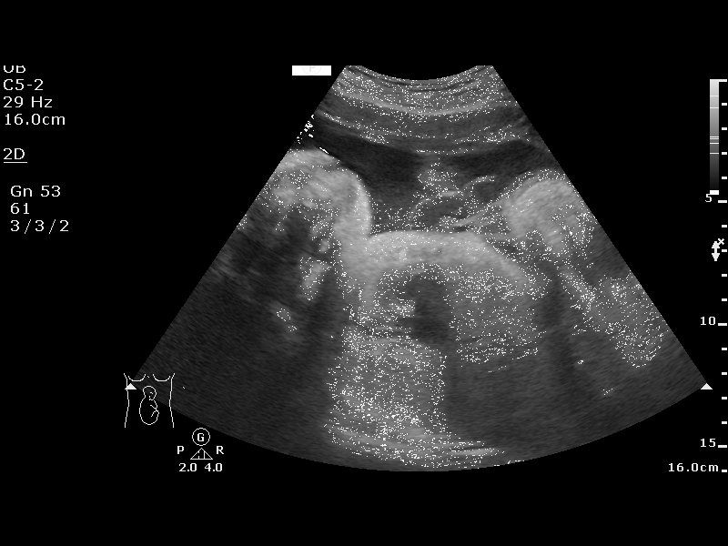

[13 of 13 positions shown; findings below may reference images not displayed]

[REDACTED]
[REDACTED]

1  US FETAL BPP W/NONSTRESS                    76818.4

1  NERY             [id number]      [id number]     [id number]
Service(s) Provided

Indications

33 weeks gestation of pregnancy
Gestational diabetes in pregnancy,
controlled by oral hypoglycemic drugs
OB History

Blood Type:            Height:  5'3"   Weight (lb):  180       BMI:
Gravidity:    3         Term:   1        Prem:   0        SAB:   1
TOP:          0       Ectopic:  0        Living: 1
Fetal Evaluation

Num Of Fetuses:     1
Preg. Location:     Intrauterine
Cardiac Activity:   Observed
Presentation:       Frank breech

Amniotic Fluid
AFI FV:      Subjectively within normal limits

AFI Sum(cm)     %Tile       Largest Pocket(cm)
22.14           84

RUQ(cm)       RLQ(cm)       LUQ(cm)        LLQ(cm)
7.55
Biophysical Evaluation
Amniotic F.V:   Pocket => 2 cm two         F. Tone:         Observed
planes
F. Movement:    Observed                   N.S.T:           Reactive
F. Breathing:   Observed                   Score:           [DATE]
Gestational Age

LMP:           33w 4d        Date:  05/23/17                 EDD:   02/27/18
Best:          33w 4d     Det. By:  LMP  (05/23/17)          EDD:   02/27/18
Impression

Recommendations

Reassuring fetal testing. Cont weekly testing

## 2018-11-09 IMAGING — US US MFM FETAL BPP W/O NON-STRESS
1 series · 14 of 28 positions shown · non-contrast
Comparison: none

[Series 1: us mfm fetal bpp w/o non-stress · 14 of 59 slices shown]
[im 3/59]
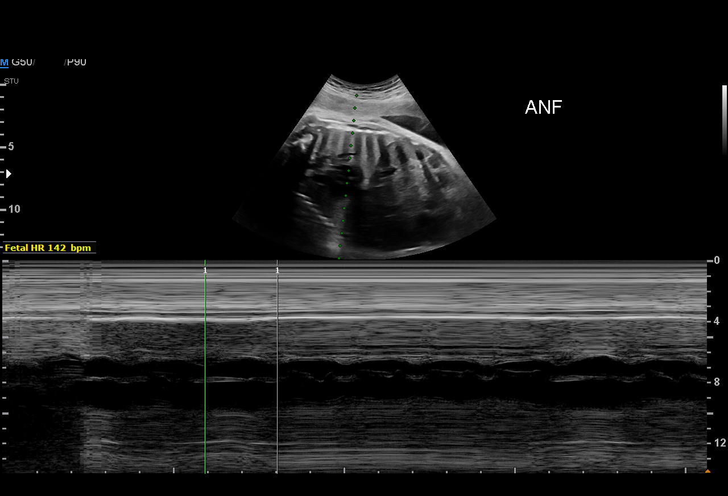
[im 7/59]
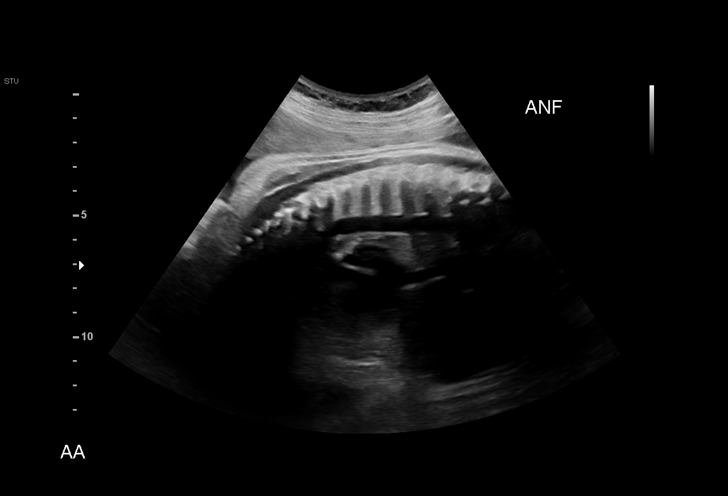
[im 11/59]
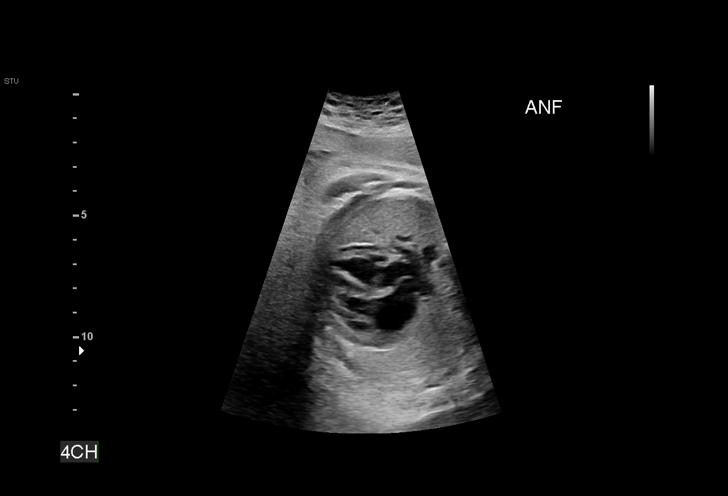
[im 16/59]
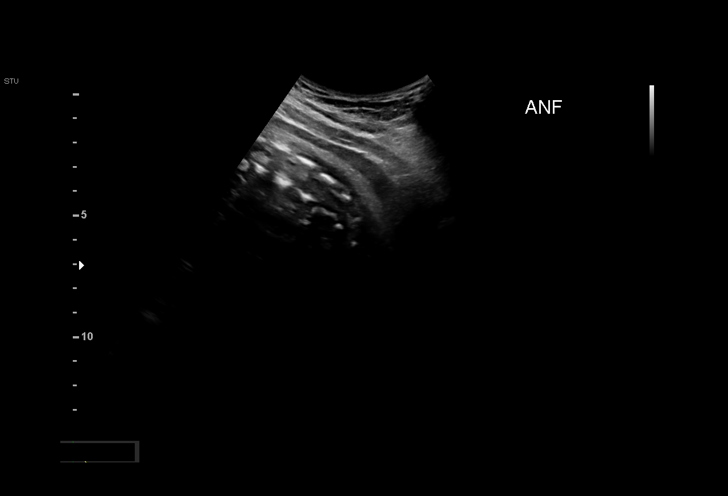
[im 20/59]
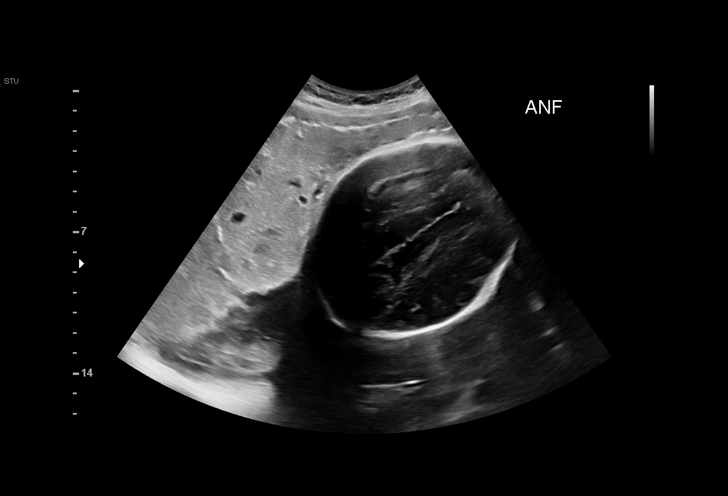
[im 24/59]
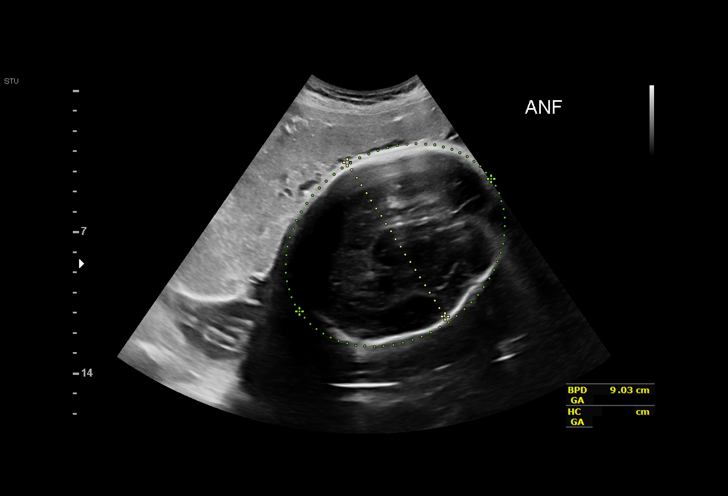
[im 28/59]
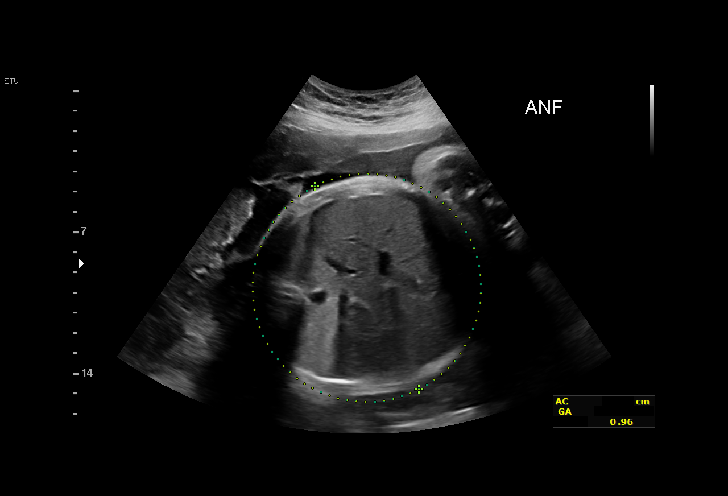
[im 33/59]
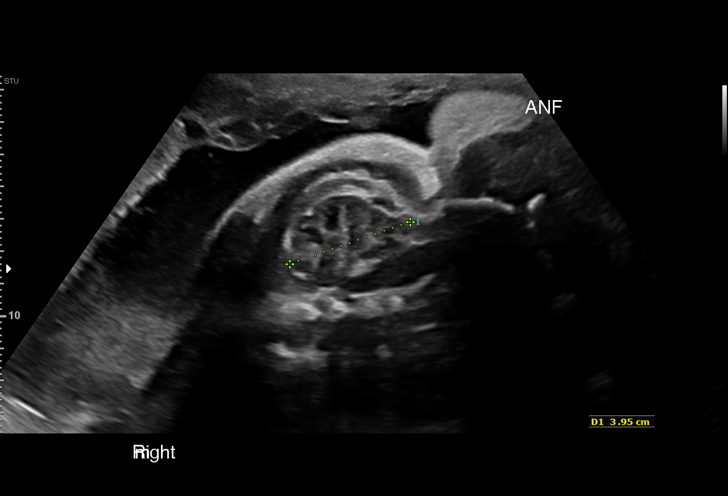
[im 37/59]
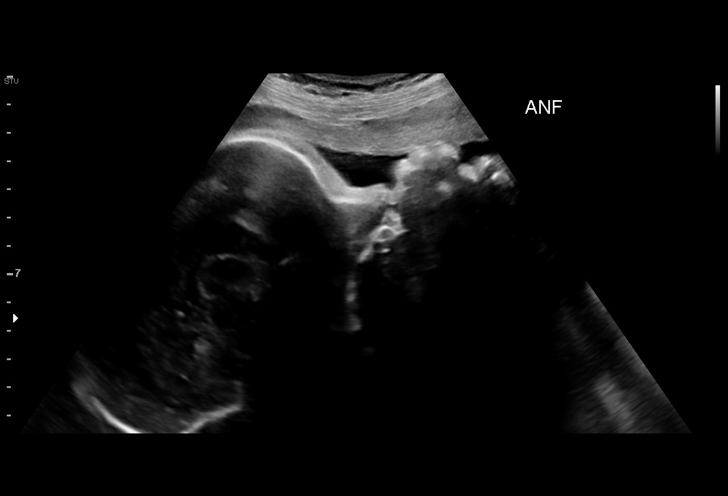
[im 41/59]
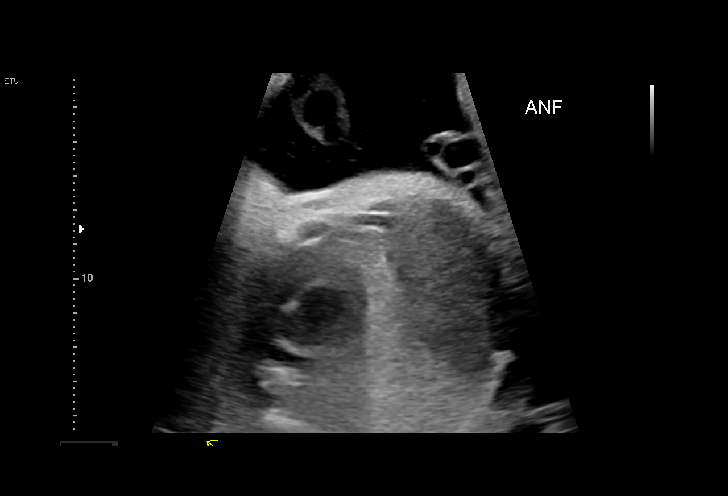
[im 46/59]
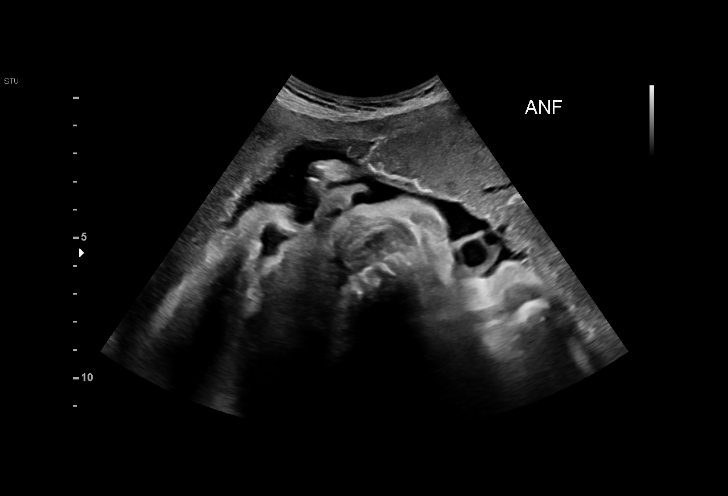
[im 50/59]
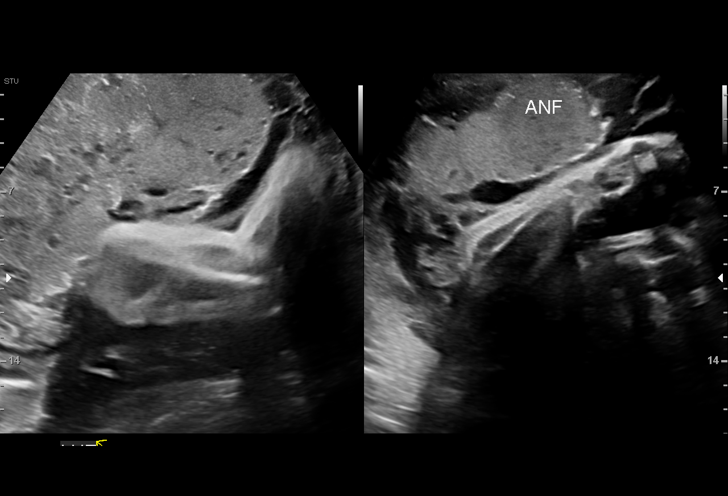
[im 54/59]
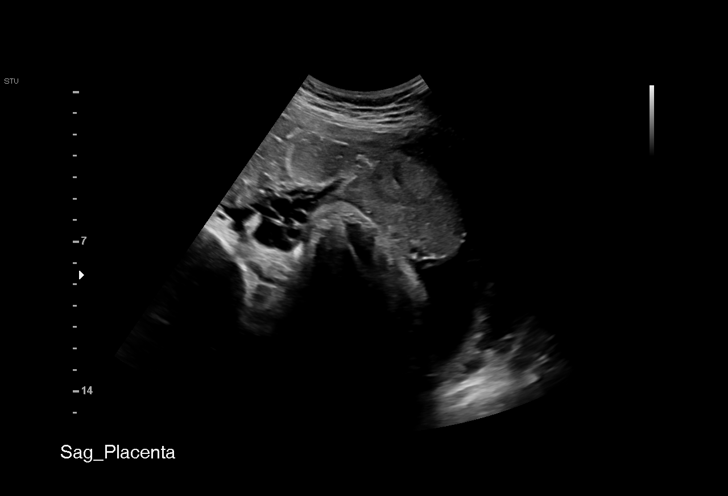
[im 59/59]
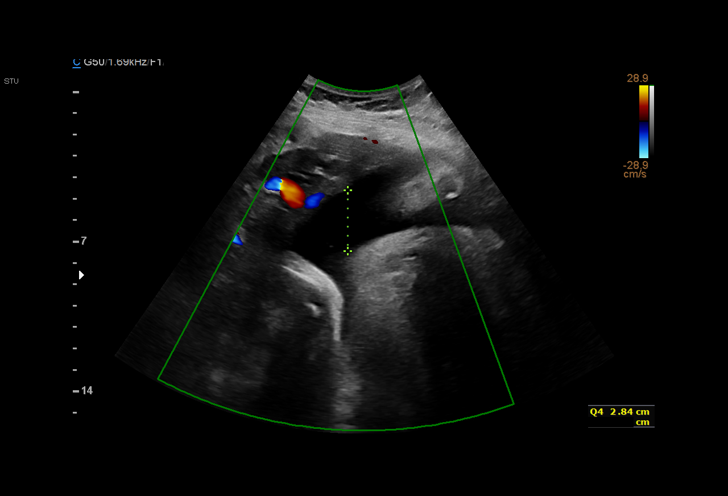

[14 of 28 positions shown; findings below may reference images not displayed]

DYANES

1  CHIYERE MARIAH              [id number]      [id number]     [id number]
QUEENA
Indications

34 weeks gestation of pregnancy
Gestational diabetes in pregnancy,
controlled by oral hypoglycemic drugs
OB History

Blood Type:            Height:  5'3"   Weight (lb):  180       BMI:
Gravidity:    3         Term:   1        Prem:   0        SAB:   1
TOP:          0       Ectopic:  0        Living: 1
Fetal Evaluation

Num Of Fetuses:     1
Fetal Heart         142
Rate(bpm):
Cardiac Activity:   Observed
Presentation:       Breech
Placenta:           Anterior, above cervical os
P. Cord Insertion:  Previously Visualized

Amniotic Fluid
AFI FV:      Subjectively within normal limits

AFI Sum(cm)     %Tile       Largest Pocket(cm)
24.8            95

RUQ(cm)       RLQ(cm)       LUQ(cm)        LLQ(cm)
5.9
Biophysical Evaluation

Amniotic F.V:   Within normal limits       F. Tone:        Observed
F. Movement:    Observed                   Score:          [DATE]
F. Breathing:   Observed
Biometry

BPD:        90  mm     G. Age:  36w 4d         92  %    CI:        70.09   %    70 - 86
FL/HC:      19.4   %    20.1 -
HC:      342.9  mm     G. Age:  39w 4d       > 97  %    HC/AC:      0.95        0.93 -
AC:       362   mm     G. Age:  40w 1d       > 97  %    FL/BPD:     73.9   %    71 - 87
FL:       66.5  mm     G. Age:  34w 2d         33  %    FL/AC:      18.4   %    20 - 24

Est. FW:    5383  gm    7 lb 10 oz    > 90  %
Gestational Age

LMP:           34w 4d        Date:  05/23/17                 EDD:   02/27/18
U/S Today:     37w 5d                                        EDD:   02/05/18
Best:          34w 4d     Det. By:  LMP  (05/23/17)          EDD:   02/27/18
Anatomy

Cranium:               Appears normal         Aortic Arch:            Appears normal
Cavum:                 Appears normal         Ductal Arch:            Appears normal
Ventricles:            Appears normal         Diaphragm:              Appears normal
Choroid Plexus:        Appears normal         Stomach:                Appears normal, left
sided
Cerebellum:            Not well visualized    Abdomen:                Appears normal
Posterior Fossa:       Not well visualized    Abdominal Wall:         Not well visualized
Nuchal Fold:           Not applicable (>20    Cord Vessels:           Appears normal (3
wks GA)                                        vessel cord)
Face:                  Appears normal         Kidneys:                Appear normal
(orbits and profile)
Lips:                  Appears normal         Bladder:                Appears normal
Thoracic:              Appears normal         Spine:                  Appears normal
Heart:                 Appears normal         Upper Extremities:      Visualized
(4CH, axis, and
situs)
RVOT:                  Appears normal         Lower Extremities:      Previously seen
LVOT:                  Not well visualized

Other:  Fetus appears to be a female. RT Heel previously visualized.
Technically difficult due to advanced GA and fetal position.
Cervix Uterus Adnexa

Cervix
Not visualized (advanced GA >09wks)
Impression

Single living intrauterine pregnancy at 34w 4d.
Breech presentation.
Growth at >90th centile and AC > 97th centile
Normal amniotic fluid volume.
BPP [DATE].
Recommendations

Reassuring fetal testing. Cont weekly testing

## 2019-01-22 IMAGING — US US TRANSVAGINAL NON-OB
1 series · 15 of 25 positions shown · non-contrast
Comparison: None

CLINICAL DATA: Postpartum bleeding



[Series 1: us transvaginal non-ob · 62 acquisitions, 15 frames shown]
[im 1/62]
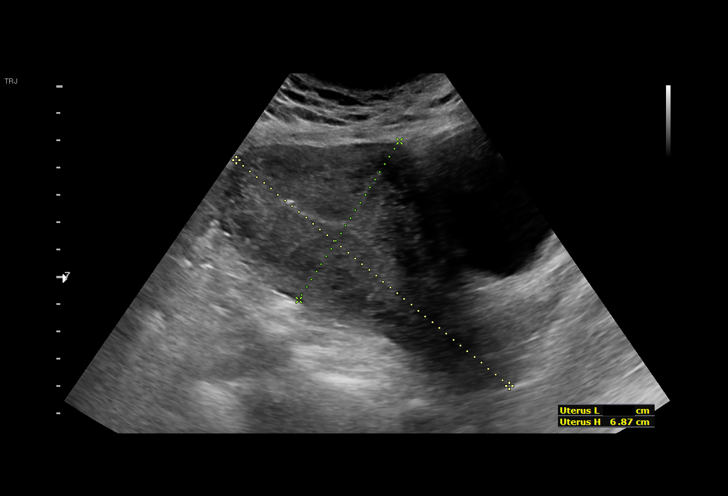
[im 6/62]
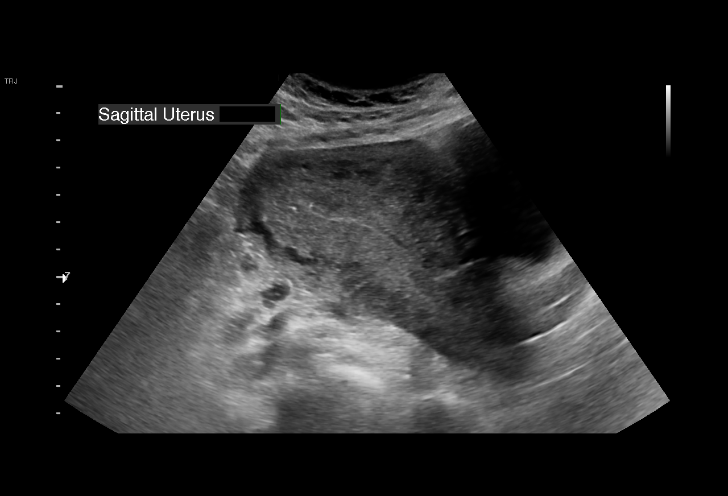
[im 11/62]
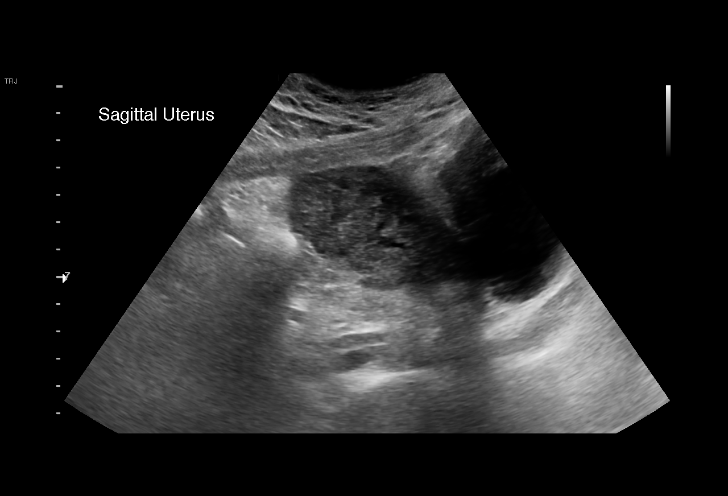
[im 13/62]
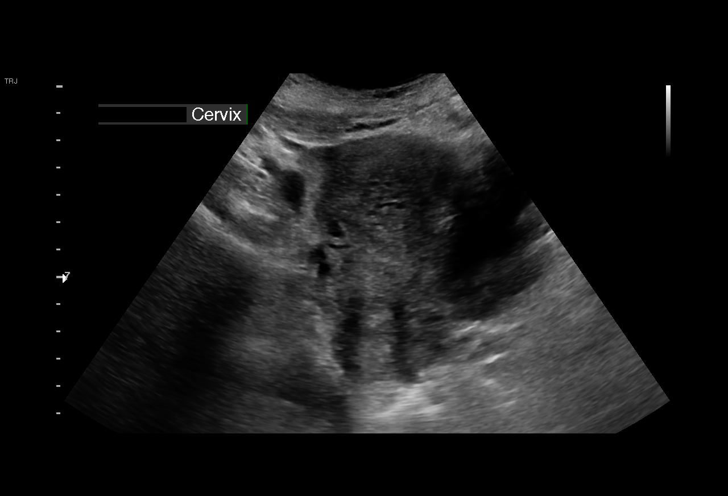
[im 18/62]
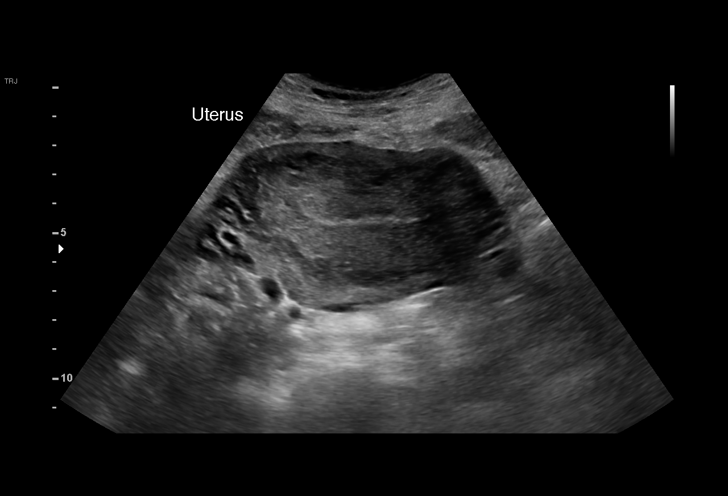
[im 23/62]
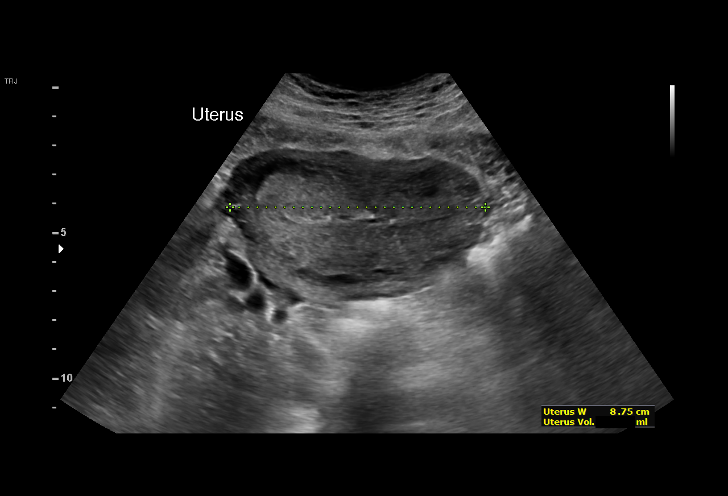
[im 26/62]
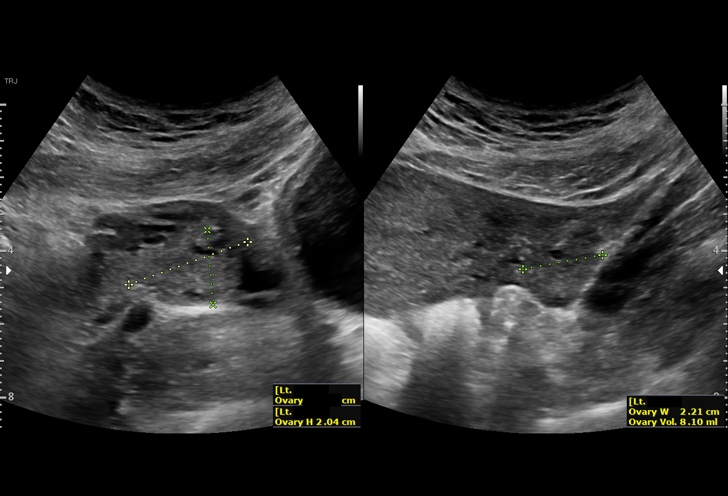
[im 31/62]
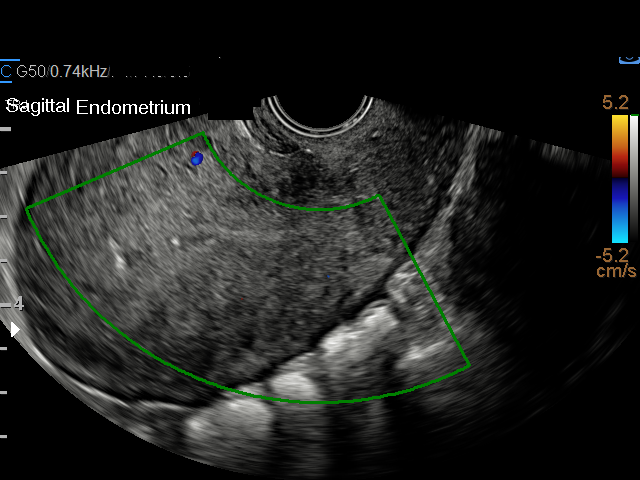
[im 36/62]
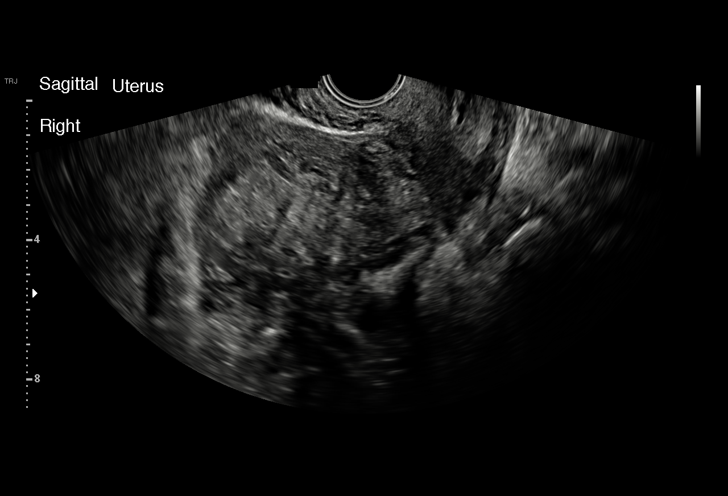
[im 39/62]
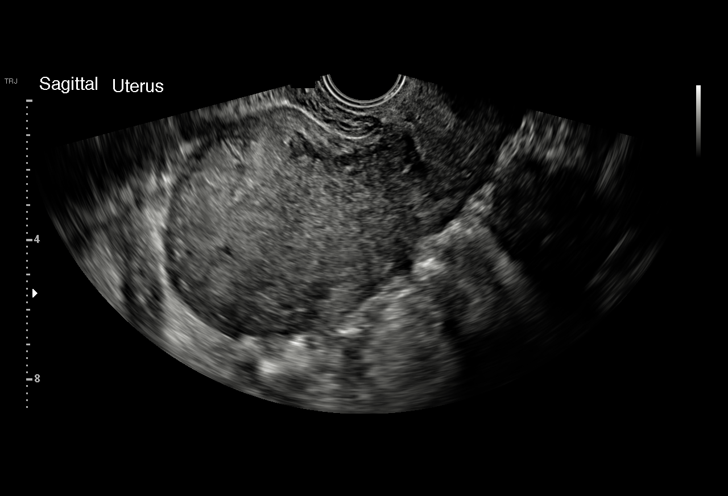
[im 44/62]
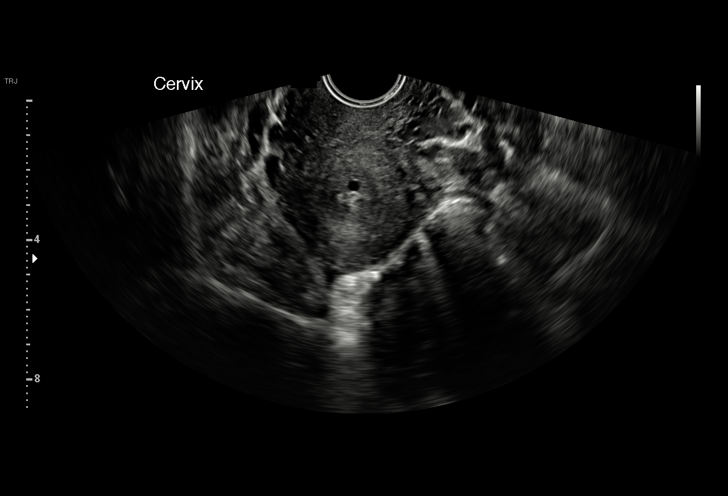
[im 49/62]
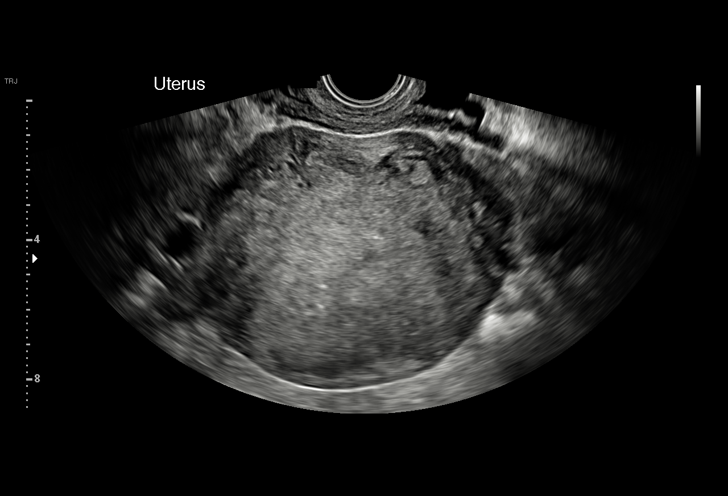
[im 51/62]
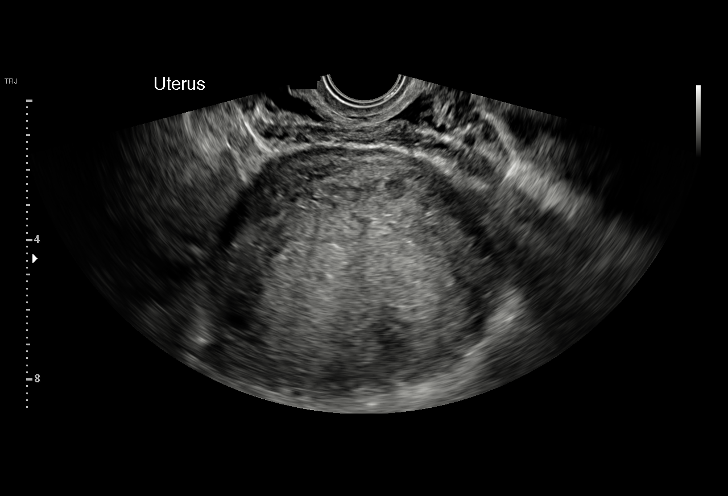
[im 56/62]
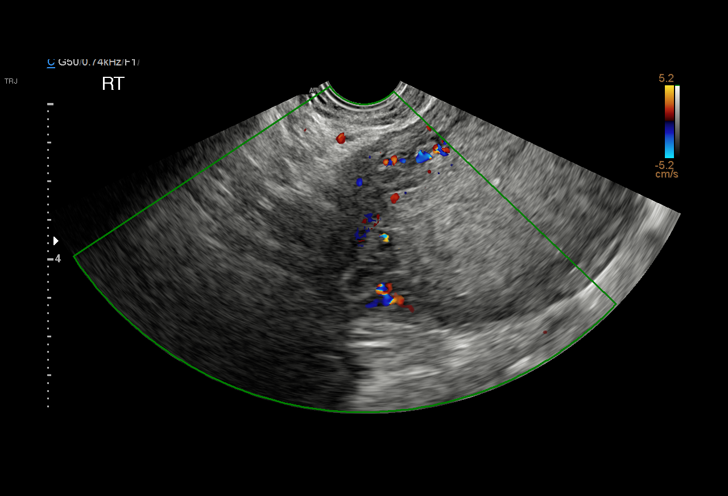
[im 62/62]
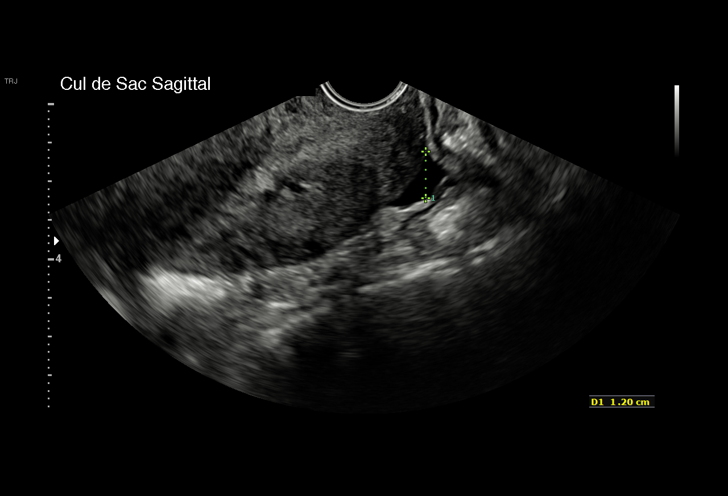

[15 of 25 positions shown; findings below may reference images not displayed]

FINDINGS: Uterus

Measurements: 12.4 x 7.0 x 8.7 cm. No fibroids or other mass
visualized.

Endometrium

Thickness: 9 mm in thickness.. Heterogeneous appearance with
internal mobile debris.

Right ovary

Measurements: 4.1 x 1.4 x 2.3 cm. Normal appearance/no adnexal mass.

Left ovary

Measurements: 3.4 x 2.0 x 2.0 cm. Normal appearance/no adnexal mass.

Other findings

No abnormal free fluid.
IMPRESSION: Mobile debris within the endometrium. This may reflect blood
products. Endometrium is of normal thickness. No visible retained
products of conception.

## 2019-07-26 ENCOUNTER — Emergency Department (HOSPITAL_COMMUNITY)
Admission: EM | Admit: 2019-07-26 | Discharge: 2019-07-26 | Disposition: A | Discharge status: Home / Self Care | Payer: Medicaid Other | Attending: Emergency Medicine | Admitting: Emergency Medicine

## 2019-07-26 ENCOUNTER — Other Ambulatory Visit: Payer: Self-pay

## 2019-07-26 DIAGNOSIS — Z87891 Personal history of nicotine dependence: Secondary | ICD-10-CM | POA: Insufficient documentation

## 2019-07-26 DIAGNOSIS — R590 Localized enlarged lymph nodes: Secondary | ICD-10-CM | POA: Insufficient documentation

## 2019-07-26 DIAGNOSIS — N898 Other specified noninflammatory disorders of vagina: Secondary | ICD-10-CM | POA: Insufficient documentation

## 2019-07-26 LAB — URINALYSIS, ROUTINE W REFLEX MICROSCOPIC
Bilirubin Urine: NEGATIVE
Glucose, UA: NEGATIVE mg/dL
Hgb urine dipstick: NEGATIVE
Ketones, ur: NEGATIVE mg/dL
Leukocytes,Ua: NEGATIVE
Nitrite: NEGATIVE
Protein, ur: NEGATIVE mg/dL
Specific Gravity, Urine: 1.018 (ref 1.005–1.030)
pH: 5 (ref 5.0–8.0)

## 2019-07-26 LAB — WET PREP, GENITAL
Clue Cells Wet Prep HPF POC: NONE SEEN
Sperm: NONE SEEN
Trich, Wet Prep: NONE SEEN
Yeast Wet Prep HPF POC: NONE SEEN

## 2019-07-26 LAB — POC URINE PREG, ED: Preg Test, Ur: NEGATIVE

## 2019-07-26 MED ORDER — AMOXICILLIN 500 MG PO CAPS: 500.0000 mg | ORAL_CAPSULE | Freq: Three times a day (TID) | ORAL | 0 refills | Status: DC

## 2019-07-26 NOTE — ED Triage Notes (Signed)
Pt reports brown vaginal discharge and odor. Pt states that this started after her period last week.

## 2019-07-26 NOTE — ED Provider Notes (Signed)
[facility] Provider Note   CSN: [id number] Arrival date & time: 07/26/19  [id number]    History   Chief Complaint Chief Complaint  Patient presents with  . Vaginal Discharge    HPI Durojayaih-Tanginika Corky SingMuir is a 28 y.o. female presents to the ER for evaluation of 2 concerns.  Patient noticed nontender swelling below her chin 3 to 4 days ago, it has slightly increased in size.  She denies any recent infectious illnesses such as URI, lower respiratory infection, sore throat.  She denies fevers, tenderness to the area, sore throat, cough congestion, sublingual swelling, changes in her voice, neck pain or stiffness.  No interventions.  No modifying factors.  Also reports painless dark brown/bloody vaginal discharge onset 3 to 4 days ago.  Scant, only streaks in her underwear and with wiping.  Associated with different odor. Her LMP 8/2-8/7.  States she typically does not have vaginal spotting after her period ends are in between her periods.  G3, P2 A1, last had a C-section on 03/2019.  She received Depo. injection at hospital discharge.  This is the first time she has been on Depo.  She had unprotected sexual intercourse 1 month ago.  She denies any fevers, nausea, vomiting, abdominal pain, dysuria, vaginal itching or lesions.      HPI  Past Medical History:  Diagnosis Date  . Anemia 2012  . Chlamydia   . Depo-Provera contraceptive status 07/10/2012   Received IM x1 on POD#2 07/09/12  . Depo-Provera contraceptive status 07/10/2012   Received IM x1 on POD#2 07/09/12   . Depression 2011   NO COUNSELING OR MEDS  . Family history of cancer   . Genetic testing 12/02/2017   Common Cancers panel (47 genes) @ [facility] - No pathogenic mutations detected  . Gonorrhea   . Heart murmur    SINCE BIRTH  . Infection 2009   CHLAMYDIA  . 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 Infection    YEAST X1  . Lactating mother 07/10/2012  . Miscarriage   . Postpartum anemia 07/10/2012     Patient Active Problem List   Diagnosis Date Noted  . Genetic testing 12/02/2017  . Family history of cancer   . History of gestational diabetes 11/23/2017  . H/O: C-section 11/07/2017    Past Surgical History:  Procedure Laterality Date  . CESAREAN SECTION  07/07/2012   Procedure: CESAREAN SECTION;  Surgeon: Purcell NailsAngela Y Roberts, MD;  Location: [facility];  Service: Gynecology;  Laterality: N/A;  . CESAREAN SECTION N/A 02/10/2018   Procedure: CESAREAN SECTION;  Surgeon: Reva BoresPratt, Tanya S, MD;  [facility];  Service: Obstetrics;  Laterality: N/A;  . WISDOM TOOTH EXTRACTION     AGE 75     OB History    Gravida  3   Para  2   Term  2   Preterm  0   AB  1   Living  2     SAB  1   TAB  0   Ectopic  0   Multiple  0   Live Births  2            Home Medications    Prior to Admission medications   Medication Sig Start Date End Date Taking? Authorizing Provider  amoxicillin (AMOXIL) 500 MG capsule Take 1 capsule (500 mg total) by mouth 3 (three) times daily. 07/26/19   Liberty HandyGibbons, Wrigley Winborne J, PA-C    Family History Family History  Problem  Relation Age of Onset  . Hypertension Mother   . Miscarriages / [place] Mother   . Hypertension Father   . Diabetes Father   . Heart disease Father   . Kidney disease Father        MASS; KIDNEY REMOVED  . Stroke Father   . Colon polyps Father        #/type unknown  . Asthma Sister   . Cancer Paternal Grandmother        colon; deceased 41  . Asthma Brother   . Lupus Sister   . Lupus Cousin   . Cancer Other        paternal half-sister; stomach ca; deceased 75  . Colon polyps Other        paternal half-brother; #/type unknown  . Anesthesia problems Neg Hx   . Hypotension Neg Hx   . Malignant hyperthermia Neg Hx   . Pseudochol deficiency Neg Hx     Social History Social History   Tobacco Use  . Smoking status: Former Smoker    Packs/day: 0.25    Quit date: 11/02/2011    Years since quitting: 7.7  .  Smokeless tobacco: Never Used  Substance Use Topics  . Alcohol use: No  . Drug use: No     Allergies   Patient has no known allergies.   Review of Systems Review of Systems  HENT:       Submental swelling   Genitourinary: Positive for vaginal bleeding and vaginal discharge.  All other systems reviewed and are negative.    Physical Exam Updated Vital Signs BP 108/60 (BP Location: Right Arm)   Pulse 64   Temp 98.6 F (37 C) (Oral)   Resp 16   SpO2 100%   Physical Exam Vitals signs and nursing note reviewed.  Constitutional:      General: She is not in acute distress.    Appearance: She is well-developed.     Comments: NAD.  HENT:     Head: Normocephalic and atraumatic.     Right Ear: External ear normal.     Left Ear: External ear normal.     Nose: Nose normal.     Mouth/Throat:     Comments: MMM. Oropharynx and tonsils normal. SL space normal without edema, tenderness. Salivary glands normal without stones, erythema, edema, drainage. Tolerating secretions. No trismus.  Eyes:     General: No scleral icterus.    Conjunctiva/sclera: Conjunctivae normal.  Neck:     Musculoskeletal: Normal range of motion and neck supple.     Comments: Focal slightly mobile cyst vs mass to submental space, non tender without fluctuance, erythema, fluctuance. No other cervical LAD. Trachea midline. No asymmetric anterior/lateral neck edema.  Cardiovascular:     Rate and Rhythm: Normal rate and regular rhythm.     Heart sounds: Normal heart sounds. No murmur.  Pulmonary:     Effort: Pulmonary effort is normal.     Breath sounds: Normal breath sounds. No wheezing.  Genitourinary:    Vagina: Vaginal discharge present.     Comments:  Exam performed with EMT at bedside for assistance. External genitalia without lesions.  No groin lymphadenopathy.  Vaginal mucosa and cervix pink without lesions.  Scant dark brown discharge on cervix which is closed. No CMT.  Nonpalpable, nontender adnexa.   Perianal skin normal without lesions. Musculoskeletal: Normal range of motion.        General: No deformity.  Skin:    General: Skin is warm and dry.  Capillary Refill: Capillary refill takes less than 2 seconds.  Neurological:     Mental Status: She is alert and oriented to person, place, and time.  Psychiatric:        Behavior: Behavior normal.        Thought Content: Thought content normal.        Judgment: Judgment normal.      ED Treatments / Results  Labs (all labs ordered are listed, but only abnormal results are displayed) Labs Reviewed  WET PREP, GENITAL - Abnormal; Notable for the following components:      Result Value   WBC, Wet Prep HPF POC MODERATE (*)    All other components within normal limits  URINE CULTURE  URINALYSIS, ROUTINE W REFLEX MICROSCOPIC  POC URINE PREG, ED  GC/CHLAMYDIA PROBE AMP ([place]    EKG None  Radiology No results found.  Procedures Procedures (including critical care time)  Medications Ordered in ED Medications - No data to display   Initial Impression / Assessment and Plan / ED Course  I have reviewed the triage vital signs and the nursing notes.  Pertinent labs & imaging results that were available during my care of the patient were reviewed by me and considered in my medical decision making (see chart for details).  Clinical Course as of Jul 25 1349  Thu Jul 26, 2019  1136 WBC, Wet Prep HPF POC(!): MODERATE [CG]    Clinical Course User Index [CG] Liberty HandyGibbons, Kynadie Yaun J, PA-C    28 y.o. yo female with two concerns.   Submental edema is most consistent with submental lymph node etiology.  No overlaying signs of infection, abscess.  Bedside US reveals two circular hypoechoic structures each measuring approx 1.1-1.3 cm in diameter.  No cobble stoning or loculations to suggest cellulitis, abscess. No overlaying skin changes.  No signs of ludwig's, deeper neck, throat infection. No fever. She denies recent  illnesses.  No travel.  She denies systemic symptoms.  she is 4 months post partum and OB work up/testing benign.  Discussed with patient etiology is unclear but will try round of amoxicillin.  I don't think there is indication for emergent imaging or lab work here today.  Recommended ENT f/u if symptoms persist after antibiotics. Strict return precautions given.  She is comfortable with this plan.   Vaginal discharge on exam was minimal, brown/bloody. Hcg negative. UA negative. Wet prep with moderate WBC but no yeast, BV, trich. She receipted Depo at delivery 4 months ago which could be causing irregular bleeding.  recent unprotected sexual encounter also puts her at risk for STD causing discharge. I recommended empiric azithromycin/rocephin in ER given her recent exposure but she declined and would like to wait for results before treating.  No fever, abdominal pain, CMT on exam to suggest PID.  F/u with OBGYN for persistent vaginal spotting/discharge if pending STD panel is negative. Return precautions given.    Final Clinical Impressions(s) / ED Diagnoses   Final diagnoses:  Vaginal discharge  Submental adenopathy    ED Discharge Orders         Ordered    amoxicillin (AMOXIL) 500 MG capsule  3 times daily     07/26/19 1224           Liberty HandyGibbons, Sheretta Grumbine J, [place] JerseyPA-C 07/26/19 1350    Pricilla LovelessGoldston, Scott, MD 07/26/19 [phone]

## 2019-07-26 NOTE — Discharge Instructions (Signed)
You were seen in the ER for swelling underneath your chin.  We used ultrasound and saw to fluid-filled areas there that may be swollen lymph nodes.  We will do a trial of antibiotics.  If symptoms persist after antibiotics she should see an ear, nose and throat specialist.  Return to the ER if there is fever, sore throat, cough, difficulty opening jaw, difficulty swallowing, if the swelling becomes tender or red or warm  You were also seen in the ER for vaginal discharge.  Swabs today were negative for trichomonas, yeast, bacterial vaginosis.  Your urine sample was normal.  The cause of your symptoms is still unclear but it may be related to Depo injections which classically cause intermittent spotting in between periods.  We tested you for gonorrhea and chlamydia but you decided to wait for treatment until the results.  The results usually take 3 to 7 days.  If positive someone will contact you.  You can also follow-up on your test results on MyChart.  If you are testing comes back normal and you are still having vaginal spotting you should see OB/GYN for further evaluation.  Return to the ER for abdominal or pelvic pain, heavier hemorrhage, fevers, burning with urination

## 2019-07-27 LAB — URINE CULTURE: Culture: <10000 — AB

## 2019-07-27 LAB — GC/CHLAMYDIA PROBE AMP (~~LOC~~) NOT AT ARMC
Chlamydia: NEGATIVE
Neisseria Gonorrhea: NEGATIVE

## 2019-08-03 DIAGNOSIS — R590 Localized enlarged lymph nodes: Secondary | ICD-10-CM | POA: Insufficient documentation

## 2020-10-18 ENCOUNTER — Encounter (HOSPITAL_COMMUNITY): Payer: Self-pay | Admitting: Emergency Medicine

## 2020-10-18 ENCOUNTER — Other Ambulatory Visit: Payer: Self-pay

## 2020-10-18 ENCOUNTER — Emergency Department (HOSPITAL_COMMUNITY): Payer: Self-pay

## 2020-10-18 ENCOUNTER — Emergency Department (HOSPITAL_COMMUNITY)
Admission: EM | Admit: 2020-10-18 | Discharge: 2020-10-18 | Disposition: A | Discharge status: Home / Self Care | Payer: Self-pay | Attending: Emergency Medicine | Admitting: Emergency Medicine

## 2020-10-18 DIAGNOSIS — S61210A Laceration without foreign body of right index finger without damage to nail, initial encounter: Secondary | ICD-10-CM | POA: Insufficient documentation

## 2020-10-18 DIAGNOSIS — Y289XXA Contact with unspecified sharp object, undetermined intent, initial encounter: Secondary | ICD-10-CM | POA: Insufficient documentation

## 2020-10-18 DIAGNOSIS — Y93E5 Activity, floor mopping and cleaning: Secondary | ICD-10-CM | POA: Insufficient documentation

## 2020-10-18 DIAGNOSIS — Y999 Unspecified external cause status: Secondary | ICD-10-CM | POA: Insufficient documentation

## 2020-10-18 DIAGNOSIS — Z87891 Personal history of nicotine dependence: Secondary | ICD-10-CM | POA: Insufficient documentation

## 2020-10-18 DIAGNOSIS — Y92000 Kitchen of unspecified non-institutional (private) residence as  the place of occurrence of the external cause: Secondary | ICD-10-CM | POA: Insufficient documentation

## 2020-10-18 MED ORDER — LIDOCAINE HCL (PF) 1 % IJ SOLN
5.0000 mL | Freq: Once | INTRAMUSCULAR | Status: AC
  Administered 2020-10-18: 5 mL
  Filled 2020-10-18: qty 5

## 2020-10-18 NOTE — ED Provider Notes (Signed)
[facility] EMERGENCY DEPARTMENT Provider Note   CSN: [id number] Arrival date & time: 10/18/20  1500     History Chief Complaint  Patient presents with  . Laceration    Rachel [name] is a 29 y.o. female.  RHD  The history is provided by the patient.  Laceration Location:  Finger Finger laceration location:  R index finger Length:  1.5cm Depth:  Cutaneous Quality: straight   Bleeding: controlled   Injury mechanism: unk metal object in oven as she was cleaning. Associated symptoms: no fever and no rash        Past Medical History:  Diagnosis Date  . Anemia 2012  . Chlamydia   . Depo-Provera contraceptive status 07/10/2012   Received IM x1 on POD#2 07/09/12  . Depo-Provera contraceptive status 07/10/2012   Received IM x1 on POD#2 07/09/12   . Depression 2011   NO COUNSELING OR MEDS  . Family history of cancer   . Genetic testing 12/02/2017   Common Cancers panel (47 genes) @ [facility] - No pathogenic mutations detected  . Gonorrhea   . Heart murmur    SINCE BIRTH  . Infection 2009   CHLAMYDIA  . 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 Infection    YEAST X1  . Lactating mother 07/10/2012  . Miscarriage   . Postpartum anemia 07/10/2012    Patient Active Problem List   Diagnosis Date Noted  . Genetic testing 12/02/2017  . Family history of cancer   . History of gestational diabetes 11/23/2017  . H/O: C-section 11/07/2017    Past Surgical History:  Procedure Laterality Date  . CESAREAN SECTION  07/07/2012   Procedure: CESAREAN SECTION;  Surgeon: Purcell Nails, MD;  Location: [facility];  Service: Gynecology;  Laterality: N/A;  . CESAREAN SECTION N/A 02/10/2018   Procedure: CESAREAN SECTION;  Surgeon: Reva Bores, MD;  Location: [facility] BIRTHING SUITES;  Service: Obstetrics;  Laterality: N/A;  . WISDOM TOOTH EXTRACTION     AGE 24     OB History    Gravida  3   Para  2   Term  2   Preterm  0   AB  1   Living  2     SAB  1   TAB  0    Ectopic  0   Multiple  0   Live Births  2           Family History  Problem Relation Age of Onset  . Hypertension Mother   . Miscarriages / [place] Mother   . Hypertension Father   . Diabetes Father   . Heart disease Father   . Kidney disease Father        MASS; KIDNEY REMOVED  . Stroke Father   . Colon polyps Father        #/type unknown  . Asthma Sister   . Cancer Paternal Grandmother        colon; deceased 76  . Asthma Brother   . Lupus Sister   . Lupus Cousin   . Cancer Other        paternal half-sister; stomach ca; deceased 42  . Colon polyps Other        paternal half-brother; #/type unknown  . Anesthesia problems Neg Hx   . Hypotension Neg Hx   . Malignant hyperthermia Neg Hx   . Pseudochol deficiency Neg Hx     Social History   Tobacco Use  . Smoking status: Former Smoker  Packs/day: 0.25    Quit date: 11/02/2011    Years since quitting: 8.9  . Smokeless tobacco: Never Used  Substance Use Topics  . Alcohol use: No  . Drug use: No    Home Medications Prior to Admission medications   Medication Sig Start Date End Date Taking? Authorizing Provider  amoxicillin (AMOXIL) 500 MG capsule Take 1 capsule (500 mg total) by mouth 3 (three) times daily. 07/26/19   [facility] Handy, PA-C    Allergies    Patient has no known allergies.  Review of Systems   Review of Systems  Constitutional: Negative for chills and fever.  HENT: Negative for ear pain and sore throat.   Eyes: Negative for pain and visual disturbance.  Respiratory: Negative for cough and shortness of breath.   Cardiovascular: Negative for chest pain and palpitations.  Gastrointestinal: Negative for abdominal pain and vomiting.  Genitourinary: Negative for dysuria and hematuria.  Musculoskeletal: Negative for arthralgias and back pain.  Skin: Positive for wound. Negative for color change and rash.  Neurological: Negative for seizures and syncope.  All other systems reviewed and  are negative.   Physical Exam Updated Vital Signs BP (!) 148/67 (BP Location: Right Arm)   Pulse 70   Temp 98.7 F (37.1 C) (Oral)   Resp 14   Ht 5\' 3"  (1.6 m)   Wt 86.2 kg   LMP 09/24/2020   SpO2 100%   BMI 33.66 kg/m   Physical Exam Vitals and nursing note reviewed.  Constitutional:      Appearance: She is well-developed. She is not ill-appearing, toxic-appearing or diaphoretic.  HENT:     Head: Normocephalic and atraumatic.  Eyes:     Conjunctiva/sclera: Conjunctivae normal.  Cardiovascular:     Rate and Rhythm: Normal rate and regular rhythm.     Heart sounds: No murmur heard.   Pulmonary:     Effort: Pulmonary effort is normal. No respiratory distress.     Breath sounds: Normal breath sounds.  Abdominal:     Palpations: Abdomen is soft.     Tenderness: There is no abdominal tenderness.  Musculoskeletal:     Right hand: Laceration and tenderness present. Normal range of motion. Normal sensation. Normal capillary refill.     Cervical back: Neck supple.     Comments: Right index finger with 1.5 cm superficial straight hemostatic laceration across the DIP joint on the palmar surface.  Patient with full range of motion of the finger, 5 out of 5 strength in that finger, can flex even against resistance, intact sensation, brisk capillary refill.  Skin:    General: Skin is warm and dry.  Neurological:     Mental Status: She is alert.     ED Results / Procedures / Treatments   Labs (all labs ordered are listed, but only abnormal results are displayed) Labs Reviewed - No data to display  EKG None  Radiology DG Finger Index Right  Result Date: 10/18/2020 CLINICAL DATA:  Laceration, rule out foreign body. EXAM: RIGHT INDEX FINGER 2+V COMPARISON:  None. FINDINGS: There is no evidence of fracture or dislocation. There is no evidence of arthropathy or other focal bone abnormality. There is a soft tissue defect in the distal soft tissues consistent with known laceration.  No radiopaque foreign body identified. IMPRESSION: Soft tissue laceration without radiopaque foreign body or acute osseous abnormality. Electronically Signed   By: 13/05/2020 M.D.   On: 10/18/2020 17:32    Procedures .13/06/2021Laceration Repair  Date/Time: 10/18/2020  6:19 PM Performed by: Loletha Carrow, MD Authorized by: Heide Scales, MD   Consent:    Consent obtained:  Verbal   Consent given by:  Patient Anesthesia (see MAR for exact dosages):    Anesthesia method:  Local infiltration   Local anesthetic:  Lidocaine 1% w/o epi Laceration details:    Location:  Finger   Finger location:  R index finger   Length (cm):  1.5 Repair type:    Repair type:  Simple Pre-procedure details:    Preparation:  Patient was prepped and draped in usual sterile fashion Exploration:    Wound exploration: wound explored through full range of motion and entire depth of wound probed and visualized     Contaminated: no   Treatment:    Area cleansed with:  Saline   Amount of cleaning:  Standard   Irrigation solution:  Sterile saline   Irrigation method:  Syringe Skin repair:    Repair method:  Sutures   Suture size:  5-0   Suture material:  Prolene   Suture technique:  Simple interrupted   Number of sutures:  4 Approximation:    Approximation:  Close Post-procedure details:    Patient tolerance of procedure:  Tolerated well, no immediate complications   (including critical care time)  Medications Ordered in ED Medications  lidocaine (PF) (XYLOCAINE) 1 % injection 5 mL (5 mLs Infiltration Given by Other 10/18/20 1759)    ED Course  I have reviewed the triage vital signs and the nursing notes.  Pertinent labs & imaging results that were available during my care of the patient were reviewed by me and considered in my medical decision making (see chart for details).    MDM Rules/Calculators/A&P                          The patient is a 29yo female, PMH otherwise healthy who  presents to the ED for laceration.  On my initial evaluation, the patient is hemodynamically stable, afebrile, nontoxic-appearing. Physical exam remarkable for laceration as described above.  Tdap up-to-date, x-ray without foreign bodies or fractures.  No concern for impaired neurovascular status to that finger.  Laceration repaired as above, patient tolerated well.  Advised patient of concern for laceration. Advised treatment of symptoms with Tylenol as needed for pain, rest, ice as needed for swelling, daily dressing with antibiotic ointment and Band-Aids. Recommended follow-up with PCP in the next couple days for suture removal in 1 week. Strict return precautions provided. Patient discharged in stable condition.   The care of this patient was overseen by Dr. Rush Landmark, who agreed with evaluation and plan of care.   Final Clinical Impression(s) / ED Diagnoses Final diagnoses:  Laceration of right index finger without foreign body without damage to nail, initial encounter    Rx / DC Orders ED Discharge Orders    None       Loletha Carrow, MD 10/18/20 1821    Tegeler, Canary Brim, MD 10/20/20 [id number]

## 2020-10-18 NOTE — ED Triage Notes (Signed)
C/o approx 1-2 cm laceration to anterior R index finger that she cut on unknown object while cleaning the bottom of her stove.  Unknown DT.  Bleeding controlled.

## 2020-10-18 NOTE — ED Notes (Signed)
E-signature pad unavailable at time of pt discharge. This RN discussed discharge materials with pt and answered all pt questions. Pt stated understanding of discharge material. ? ?

## 2020-11-17 ENCOUNTER — Other Ambulatory Visit: Payer: Self-pay

## 2020-11-17 DIAGNOSIS — Z20822 Contact with and (suspected) exposure to covid-19: Secondary | ICD-10-CM

## 2020-11-18 LAB — SARS-COV-2, NAA 2 DAY TAT

## 2020-11-18 LAB — NOVEL CORONAVIRUS, NAA: SARS-CoV-2, NAA: NOT DETECTED

## 2021-06-29 ENCOUNTER — Other Ambulatory Visit: Payer: Self-pay

## 2021-06-29 ENCOUNTER — Ambulatory Visit (HOSPITAL_COMMUNITY)
Admission: EM | Admit: 2021-06-29 | Discharge: 2021-06-29 | Disposition: A | Discharge status: Home / Self Care | Payer: Self-pay | Attending: Emergency Medicine | Admitting: Emergency Medicine

## 2021-06-29 ENCOUNTER — Encounter (HOSPITAL_COMMUNITY): Payer: Self-pay

## 2021-06-29 DIAGNOSIS — G5603 Carpal tunnel syndrome, bilateral upper limbs: Secondary | ICD-10-CM

## 2021-06-29 MED ORDER — NAPROXEN 500 MG PO TABS: 500.0000 mg | ORAL_TABLET | Freq: Two times a day (BID) | ORAL | 0 refills | Status: DC

## 2021-06-29 NOTE — ED Provider Notes (Addendum)
[facility]    CSN: [id number] Arrival date & time: 06/29/21  1105      History   Chief Complaint Chief Complaint  Patient presents with   Hand Problem    HPI Rachel [name] is a 30 y.o. female.   Patient here for bilateral wrist pain and numbness in hands that has been intermittent for the past month.  Reports symptoms are getting worse and now there is some radiation up her arms.  Has not tried any OTC medication or treatments.  Reports pain and numbness is worse at night.  Denies any trauma, injury, or other precipitating event. Denies any fevers, chest pain, shortness of breath, N/V/D, numbness, tingling, weakness, abdominal pain, or headaches.     The history is provided by the patient.   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   Infection    YEAST X1   Lactating mother 07/10/2012   Miscarriage    Postpartum anemia 07/10/2012    Patient Active Problem List   Diagnosis Date Noted   Genetic testing 12/02/2017   Family history of cancer    History of gestational diabetes 11/23/2017   H/O: C-section 11/07/2017    Past Surgical History:  Procedure Laterality Date   CESAREAN SECTION  07/07/2012   Procedure: CESAREAN SECTION;  Surgeon: Purcell Nails, MD;  Location: [facility];  Service: Gynecology;  Laterality: N/A;   CESAREAN SECTION N/A 02/10/2018   Procedure: CESAREAN SECTION;  Surgeon: Reva Bores, MD;  Location: [facility] BIRTHING SUITES;  Service: Obstetrics;  Laterality: N/A;   WISDOM  TOOTH EXTRACTION     AGE 73    OB History     Gravida  3   Para  2   Term  2   Preterm  0   AB  1   Living  2      SAB  1   IAB  0   Ectopic  0   Multiple  0   Live Births  2            Home Medications    Prior to Admission medications   Medication Sig Start Date End Date Taking? Authorizing Provider  naproxen (NAPROSYN) 500 MG tablet Take 1 tablet (500 mg total) by mouth 2 (two) times daily. 06/29/21  Yes Ivette Loyal, NP  amoxicillin (AMOXIL) 500 MG capsule Take 1 capsule (500 mg total) by mouth 3 (three) times daily. 07/26/19   [facility] Handy, PA-C    Family History Family History  Problem Relation Age of Onset   Hypertension Mother    Miscarriages / Stillbirths Mother    Hypertension Father    Diabetes Father    Heart disease Father    Kidney disease Father        MASS; KIDNEY REMOVED   Stroke Father    Colon polyps Father        #/type unknown  Asthma Sister    Cancer Paternal Grandmother        colon; deceased 66   Asthma Brother    Lupus Sister    Lupus Cousin    Cancer Other        paternal half-sister; stomach ca; deceased 24   Colon polyps Other        paternal half-brother; #/type unknown   Anesthesia problems Neg Hx    Hypotension Neg Hx    Malignant hyperthermia Neg Hx    Pseudochol deficiency Neg Hx     Social History Social History   Tobacco Use   Smoking status: Former    Packs/day: 0.25    Types: Cigarettes    Quit date: 11/02/2011    Years since quitting: 9.6   Smokeless tobacco: Never  Vaping Use   Vaping Use: Never used  Substance Use Topics   Alcohol use: No   Drug use: No     Allergies   Patient has no known allergies.   Review of Systems Review of Systems  Musculoskeletal:  Positive for arthralgias and joint swelling.  All other systems reviewed and are negative.   Physical Exam Triage Vital Signs ED Triage Vitals [06/29/21 1251]  Enc Vitals Group     BP 138/76     Pulse Rate (!) 58      Resp 16     Temp 98.2 F (36.8 C)     Temp Source Oral     SpO2 99 %     Weight      Height      Head Circumference      Peak Flow      Pain Score      Pain Loc      Pain Edu?      Excl. in GC?    No data found.  Updated Vital Signs BP 138/76 (BP Location: Right Arm)   Pulse (!) 58   Temp 98.2 F (36.8 C) (Oral)   Resp 16   LMP  (Within Weeks) Comment: 1 week  SpO2 99%   Breastfeeding No   Visual Acuity Right Eye Distance:   Left Eye Distance:   Bilateral Distance:    Right Eye Near:   Left Eye Near:    Bilateral Near:     Physical Exam Vitals and nursing note reviewed.  Constitutional:      General: She is not in acute distress.    Appearance: Normal appearance. She is not ill-appearing, toxic-appearing or diaphoretic.  HENT:     Head: Normocephalic and atraumatic.  Eyes:     Conjunctiva/sclera: Conjunctivae normal.  Cardiovascular:     Rate and Rhythm: Normal rate.     Pulses: Normal pulses.  Pulmonary:     Effort: Pulmonary effort is normal.  Abdominal:     General: Abdomen is flat.  Musculoskeletal:        General: Normal range of motion.     Right wrist: Normal. No swelling or tenderness. Normal range of motion.     Left wrist: Normal. No swelling or tenderness. Normal range of motion.     Cervical back: Normal range of motion.     Comments: Bilateral positive carpal compression test and bilateral positive Phalen's test.    Skin:    General: Skin is warm and dry.  Neurological:     General: No focal deficit present.     Mental Status: She is alert and oriented to person, place, and time.  Psychiatric:  Mood and Affect: Mood normal.     UC Treatments / Results  Labs (all labs ordered are listed, but only abnormal results are displayed) Labs Reviewed - No data to display  EKG   Radiology No results found.  Procedures Procedures (including critical care time)  Medications Ordered in UC Medications - No data to  display  Initial Impression / Assessment and Plan / UC Course  I have reviewed the triage vital signs and the nursing notes.  Pertinent labs & imaging results that were available during my care of the patient were reviewed by me and considered in my medical decision making (see chart for details).    Assessment negative for red flags or concerns.  Likely bilateral carpal tunnel syndrome.  Will treat with wrist splints at night and naproxen twice a day as needed for pain.  Recommend rest, ice, compression, and elevation.  Given exercises to help with pain.  Follow up with orthopedics is symptoms do not improve in the next few weeks.   Final Clinical Impressions(s) / UC Diagnoses   Final diagnoses:  Bilateral carpal tunnel syndrome     Discharge Instructions      Wear the wrist splints at night while you are sleeping.    Take the naproxen twice a day as needed for pain.   Do the exercises to help with pain.  Rest as much as possible Ice for 10-15 minutes every 4-6 hours as needed for pain and swelling Compression- use an ace bandage or splint for comfort Elevate above your hip/heart when sitting and laying down  Follow up with sports medicine or orthopedics if symptoms do not improve in the next few weeks.      ED Prescriptions     Medication Sig Dispense Auth. Provider   naproxen (NAPROSYN) 500 MG tablet Take 1 tablet (500 mg total) by mouth 2 (two) times daily. 30 tablet Ivette Loyal, NP      PDMP not reviewed this encounter.   Ivette Loyal, NP 06/29/21 1317    Ivette Loyal, NP 06/29/21 1340

## 2021-06-29 NOTE — Discharge Instructions (Addendum)
Wear the wrist splints at night while you are sleeping.    Take the naproxen twice a day as needed for pain.   Do the exercises to help with pain.  Rest as much as possible Ice for 10-15 minutes every 4-6 hours as needed for pain and swelling Compression- use an ace bandage or splint for comfort Elevate above your hip/heart when sitting and laying down  Follow up with sports medicine or orthopedics if symptoms do not improve in the next few weeks.

## 2021-06-29 NOTE — ED Triage Notes (Signed)
T reports bilateral hand pain x 1 month. States hand feels hot, numb and cramps at night.

## 2022-09-13 ENCOUNTER — Other Ambulatory Visit: Payer: Self-pay

## 2022-09-13 ENCOUNTER — Emergency Department (HOSPITAL_COMMUNITY)
Admission: EM | Admit: 2022-09-13 | Discharge: 2022-09-13 | Disposition: A | Discharge status: Home / Self Care | Payer: Medicaid Other | Attending: Emergency Medicine | Admitting: Emergency Medicine

## 2022-09-13 ENCOUNTER — Encounter (HOSPITAL_COMMUNITY): Payer: Self-pay | Admitting: Emergency Medicine

## 2022-09-13 DIAGNOSIS — J029 Acute pharyngitis, unspecified: Secondary | ICD-10-CM | POA: Insufficient documentation

## 2022-09-13 DIAGNOSIS — R0789 Other chest pain: Secondary | ICD-10-CM | POA: Insufficient documentation

## 2022-09-13 DIAGNOSIS — Z20822 Contact with and (suspected) exposure to covid-19: Secondary | ICD-10-CM | POA: Insufficient documentation

## 2022-09-13 DIAGNOSIS — F17210 Nicotine dependence, cigarettes, uncomplicated: Secondary | ICD-10-CM | POA: Insufficient documentation

## 2022-09-13 DIAGNOSIS — R6889 Other general symptoms and signs: Secondary | ICD-10-CM

## 2022-09-13 LAB — SARS CORONAVIRUS 2 BY RT PCR: SARS Coronavirus 2 by RT PCR: NEGATIVE

## 2022-09-13 NOTE — ED Provider Notes (Signed)
[facility] EMERGENCY DEPARTMENT Provider Note   CSN: [id number] Arrival date & time: 09/13/22  1908     History  Chief Complaint  Patient presents with   covil like symptoms    Rachel [name] is a 31 y.o. female.  HPI   31 year old female presents emergency department with complaints of runny nose, cough, body aches, chest tightness and sore throat for the past 2 days.  She is concerned about COVID and is requesting test as well as a work note.  Denies any known sick contact but states she is around many people during the day and "could have caught anything."  Notes 1 fever subjectively that occurred this morning but has not felt felt feverish since.  Denies any difficulty swallowing/breathing.  Does states that she smokes cigarettes but does not use illicit drugs.  Denies any chest pain, shortness of breath, abdominal pain, urinary/vaginal symptoms, change in bowel habits.  Home Medications Prior to Admission medications   Medication Sig Start Date End Date Taking? Authorizing Provider  amoxicillin (AMOXIL) 500 MG capsule Take 1 capsule (500 mg total) by mouth 3 (three) times daily. 07/26/19   Kinnie Feil, PA-C  naproxen (NAPROSYN) 500 MG tablet Take 1 tablet (500 mg total) by mouth 2 (two) times daily. 06/29/21   Pearson Forster, NP      Allergies    Patient has no known allergies.    Review of Systems   Review of Systems  All other systems reviewed and are negative.   Physical Exam Updated Vital Signs BP 135/75 (BP Location: Right Arm)   Pulse 65   Temp 98.9 F (37.2 C) (Oral)   Resp 15   Ht 5\' 3"  (1.6 m)   Wt 90.7 kg   LMP 09/06/2022 (Exact Date)   SpO2 96%   BMI 35.43 kg/m  Physical Exam Vitals and nursing note reviewed.  Constitutional:      General: She is not in acute distress.    Appearance: She is well-developed.  HENT:     Head: Normocephalic and atraumatic.     Nose: Congestion present.     Mouth/Throat:      Comments: Mild posterior pharyngeal erythema.  No obvious exudate.  Uvula midline and rises symmetrically with phonation.  Tonsils 1+ bilaterally with no obvious exudate. Eyes:     Conjunctiva/sclera: Conjunctivae normal.  Cardiovascular:     Rate and Rhythm: Normal rate and regular rhythm.     Heart sounds: No murmur heard. Pulmonary:     Effort: Pulmonary effort is normal. No respiratory distress.     Breath sounds: Normal breath sounds. No stridor. No rhonchi or rales.  Abdominal:     Palpations: Abdomen is soft.     Tenderness: There is no abdominal tenderness.  Musculoskeletal:        General: No swelling.     Cervical back: Neck supple. No rigidity or tenderness.  Skin:    General: Skin is warm and dry.     Capillary Refill: Capillary refill takes less than 2 seconds.  Neurological:     Mental Status: She is alert.  Psychiatric:        Mood and Affect: Mood normal.     ED Results / Procedures / Treatments   Labs (all labs ordered are listed, but only abnormal results are displayed) Labs Reviewed  SARS CORONAVIRUS 2 BY RT PCR    EKG None  Radiology No results found.  Procedures Procedures    Medications  Ordered in ED Medications - No data to display  ED Course/ Medical Decision Making/ A&P                           Medical Decision Making  This patient presents to the ED for concern of flulike symptoms, this involves an extensive number of treatment options, and is a complaint that carries with it a high risk of complications and morbidity.  The differential diagnosis includes COVID, influenza, pneumonia, URI   Co morbidities that complicate the patient evaluation  See HPI   Additional history obtained:  Additional history obtained from EMR External records from outside source obtained and reviewed including hospital records   Lab Tests:  I Ordered, and personally interpreted labs.  The pertinent results include: Coronavirus which is pending upon  discharge   Imaging Studies ordered:  Patient declined   Cardiac Monitoring: / EKG:  The patient was maintained on a cardiac monitor.  I personally viewed and interpreted the cardiac monitored which showed an underlying rhythm of: Sinus rhythm   Consultations Obtained:  N/a   Problem List / ED Course / Critical interventions / Medication management  Flulike illness Reevaluation of the patient showed that the patient stayed the same I have reviewed the patients home medicines and have made adjustments as needed   Social Determinants of Health:  Chronic cigarette use   Test / Admission - Considered:  Flulike illness Vitals signs within normal range and stable throughout visit. Laboratory studies pending upon discharge.  Patient elected for outpatient management of symptoms with therapy at home and is requesting COVID test as well as work note.  She declined any further work-up while emergency department.  This is deemed reasonable given overall well-appearing status, normal vital signs, reassuring physical exam.  I discussed at length with patient regarding proper symptomatic therapy at home as well as advised her to follow closely to MyChart regarding COVID test results.  Close follow-up with PCP recommended in 2-3 days for reevaluation of symptoms.   Worrisome signs and symptoms were discussed with the patient, and the patient acknowledged understanding to return to the ED if noticed. Patient was stable upon discharge.          Final Clinical Impression(s) / ED Diagnoses Final diagnoses:  Flu-like symptoms    Rx / DC Orders ED Discharge Orders     None         Peter Garter, [place] 09/13/22 2021    Alvira Monday, MD 09/14/22 1109

## 2022-09-13 NOTE — ED Triage Notes (Signed)
Pt reports runny nose, cough, body aches, chest tightness with inhalation, sore throat x 2 days, pt reports she has not had a covid test

## 2022-09-13 NOTE — Discharge Instructions (Signed)
Follow MyChart for the results of your COVID test.  As discussed, take Tylenol/ibuprofen as needed for pain/inflammation.  Follow-up with PCP recommended in 3 to 5 days for reevaluation.  Please not hesitate to return to the emergency department the worrisome signs and symptoms we discussed become apparent.

## 2022-09-20 ENCOUNTER — Emergency Department (HOSPITAL_BASED_OUTPATIENT_CLINIC_OR_DEPARTMENT_OTHER)
Admission: EM | Admit: 2022-09-20 | Discharge: 2022-09-20 | Disposition: A | Discharge status: Home / Self Care | Payer: Medicaid Other | Attending: Emergency Medicine | Admitting: Emergency Medicine

## 2022-09-20 ENCOUNTER — Other Ambulatory Visit: Payer: Self-pay

## 2022-09-20 ENCOUNTER — Encounter (HOSPITAL_BASED_OUTPATIENT_CLINIC_OR_DEPARTMENT_OTHER): Payer: Self-pay | Admitting: *Deleted

## 2022-09-20 ENCOUNTER — Emergency Department (HOSPITAL_BASED_OUTPATIENT_CLINIC_OR_DEPARTMENT_OTHER): Payer: Medicaid Other | Admitting: Radiology

## 2022-09-20 DIAGNOSIS — J189 Pneumonia, unspecified organism: Secondary | ICD-10-CM

## 2022-09-20 DIAGNOSIS — J181 Lobar pneumonia, unspecified organism: Secondary | ICD-10-CM | POA: Insufficient documentation

## 2022-09-20 MED ORDER — AMOXICILLIN-POT CLAVULANATE 875-125 MG PO TABS
1.0000 | ORAL_TABLET | Freq: Once | ORAL | Status: AC
  Administered 2022-09-20: 1 via ORAL
  Filled 2022-09-20: qty 1

## 2022-09-20 MED ORDER — AMOXICILLIN-POT CLAVULANATE 875-125 MG PO TABS: 1.0000 | ORAL_TABLET | Freq: Two times a day (BID) | ORAL | 0 refills | Status: DC

## 2022-09-20 NOTE — ED Notes (Signed)
Patient transported to X-ray 

## 2022-09-20 NOTE — ED Notes (Signed)
Discharge instructions, follow up care, and prescriptions reviewed and explained, pt verbalized understanding. Pt caox4 and ambulatory on d/c.  

## 2022-09-20 NOTE — ED Triage Notes (Signed)
Pt is here for reevaluation of URI with cough which is usually non productive but she has had some yellow phlegm.  Pt reports rib cage soreness with cough.  Pt has had a covid test which was negative. No fever

## 2022-09-20 NOTE — Discharge Instructions (Addendum)
Please take the entire course of antibiotics that are prescribed, this antibiotic is known to cause some diarrhea so I recommend that you take it with a probiotic, and you can use some over-the-counter Imodium if you have severe diarrhea.  Please get plenty of rest, drink plenty of fluids, and follow-up with the emergency department or your primary care doctor if your symptoms or not improving despite treatment.

## 2022-09-20 NOTE — ED Provider Notes (Signed)
[place] EMERGENCY DEPT Provider Note   CSN: [id number] Arrival date & time: 09/20/22  1831     History  Chief Complaint  Patient presents with   Cough    Rachel [name] is a 31 y.o. female with no contributory past medical history presents with concern for upper respiratory infection, cough, new production of yellow phlegm.  Patient reports that she has had some rib cage shortness with cough.  She was tested for COVID around a week ago which is when the symptoms began, reports that she had some fever, chills at that time.  She has had COVID test negative since then.  Patient is feeling congested, has ongoing cough, and wants to make sure that she is not developing pneumonia.   Cough      Home Medications Prior to Admission medications   Medication Sig Start Date End Date Taking? Authorizing Provider  amoxicillin-clavulanate (AUGMENTIN) 875-125 MG tablet Take 1 tablet by mouth every 12 (twelve) hours. 09/20/22  Yes Klever Twyford H, PA-C  amoxicillin (AMOXIL) 500 MG capsule Take 1 capsule (500 mg total) by mouth 3 (three) times daily. 07/26/19   Kinnie Feil, PA-C  naproxen (NAPROSYN) 500 MG tablet Take 1 tablet (500 mg total) by mouth 2 (two) times daily. 06/29/21   Pearson Forster, NP      Allergies    Patient has no known allergies.    Review of Systems   Review of Systems  Respiratory:  Positive for cough.   All other systems reviewed and are negative.   Physical Exam Updated Vital Signs BP (!) 145/80 (BP Location: Left Arm)   Pulse 69   Temp 98.2 F (36.8 C) (Oral)   Resp 20   LMP 09/06/2022 (Exact Date)   SpO2 99%  Physical Exam Vitals and nursing note reviewed.  Constitutional:      General: She is not in acute distress.    Appearance: Normal appearance.  HENT:     Head: Normocephalic and atraumatic.  Eyes:     General:        Right eye: No discharge.        Left eye: No discharge.  Cardiovascular:     Rate and  Rhythm: Normal rate and regular rhythm.     Heart sounds: No murmur heard.    No friction rub. No gallop.  Pulmonary:     Effort: Pulmonary effort is normal.     Breath sounds: Normal breath sounds.     Comments: Some bibasilar congestion and possible mild focal consolidation, no wheezing, respiratory distress, Stridor, rhonchi clear with cough. Abdominal:     General: Bowel sounds are normal.     Palpations: Abdomen is soft.  Skin:    General: Skin is warm and dry.     Capillary Refill: Capillary refill takes less than 2 seconds.  Neurological:     Mental Status: She is alert and oriented to person, place, and time.  Psychiatric:        Mood and Affect: Mood normal.        Behavior: Behavior normal.     ED Results / Procedures / Treatments   Labs (all labs ordered are listed, but only abnormal results are displayed) Labs Reviewed - No data to display  EKG None  Radiology DG Chest 2 View  Result Date: 09/20/2022 CLINICAL DATA:  Cough for over a week. Rib soreness. EXAM: CHEST - 2 VIEW COMPARISON:  None Available. FINDINGS: Patchy airspace disease within the lung  bases may represent atelectasis or pneumonia. The heart is normal in size. No pulmonary edema, pleural effusion or pneumothorax. No acute osseous findings. IMPRESSION: Patchy bibasilar airspace disease may represent atelectasis or pneumonia. Electronically Signed   By: Narda Rutherford M.D.   On: 09/20/2022 19:02    Procedures Procedures    Medications Ordered in ED Medications  amoxicillin-clavulanate (AUGMENTIN) 875-125 MG per tablet 1 tablet (1 tablet Oral Given 09/20/22 2022)    ED Course/ Medical Decision Making/ A&P                           Medical Decision Making Amount and/or Complexity of Data Reviewed Radiology: ordered.   This is an overall well-appearing 31 year old female who presents with concern for ongoing cough, yellow production of mucus with upper respiratory infection that began around a  week ago.  On arrival to the emergency department she is afebrile with stable oxygen saturation on room air.  Emergent differential diagnosis includes upper respiratory infection, COVID, flu, acute bronchitis, versus atypical pneumonia versus other.  I independently interpreted the chest x-ray which shows some patchy opacities in bibasilar lobes consistent with possible multifocal pneumonia versus atelectasis.  This patient has stable vital signs, no hypoxia, no fever at this time, and otherwise without significant comorbidities I do not think that patient will require any additional lab work or work-up at this time.  She is not wheezing, has no respiratory distress on my exam.  We will discharge with Augmentin, PCP follow-up, encouraged return precautions.  Patient discharged in stable condition at this time. Final Clinical Impression(s) / ED Diagnoses Final diagnoses:  Pneumonia of both lower lobes due to infectious organism    Rx / DC Orders ED Discharge Orders          Ordered    amoxicillin-clavulanate (AUGMENTIN) 875-125 MG tablet  Every 12 hours        09/20/22 2010              [place] 09/20/22 2037    Gwyneth Sprout, MD 09/23/22 1733

## 2023-07-27 ENCOUNTER — Other Ambulatory Visit: Payer: Self-pay

## 2023-07-27 ENCOUNTER — Encounter (HOSPITAL_BASED_OUTPATIENT_CLINIC_OR_DEPARTMENT_OTHER): Payer: Self-pay

## 2023-07-27 ENCOUNTER — Emergency Department (HOSPITAL_BASED_OUTPATIENT_CLINIC_OR_DEPARTMENT_OTHER): Payer: Medicaid Other | Admitting: Radiology

## 2023-07-27 ENCOUNTER — Emergency Department (HOSPITAL_BASED_OUTPATIENT_CLINIC_OR_DEPARTMENT_OTHER)
Admission: EM | Admit: 2023-07-27 | Discharge: 2023-07-27 | Disposition: A | Discharge status: Home / Self Care | Payer: Self-pay | Attending: Emergency Medicine | Admitting: Emergency Medicine

## 2023-07-27 DIAGNOSIS — R059 Cough, unspecified: Secondary | ICD-10-CM | POA: Insufficient documentation

## 2023-07-27 DIAGNOSIS — Z1152 Encounter for screening for COVID-19: Secondary | ICD-10-CM | POA: Insufficient documentation

## 2023-07-27 DIAGNOSIS — R0981 Nasal congestion: Secondary | ICD-10-CM | POA: Insufficient documentation

## 2023-07-27 LAB — RESP PANEL BY RT-PCR (RSV, FLU A&B, COVID)  RVPGX2
Influenza A by PCR: NEGATIVE
Influenza B by PCR: NEGATIVE
Resp Syncytial Virus by PCR: NEGATIVE
SARS Coronavirus 2 by RT PCR: NEGATIVE

## 2023-07-27 MED ORDER — IBUPROFEN 800 MG PO TABS
800.0000 mg | ORAL_TABLET | Freq: Once | ORAL | Status: AC
  Administered 2023-07-27: 800 mg via ORAL
  Filled 2023-07-27: qty 1

## 2023-07-27 NOTE — ED Triage Notes (Signed)
Pt arrived from home via Pov c/o cough and congestion that began yesterday. Pt states that dayquil and thera flu

## 2023-07-27 NOTE — Discharge Instructions (Signed)
Evaluation today was overall reassuring.  Recommend you have a viral upper respiratory infection.  The treatment is conservative.  It includes rest and assertive hydration.  Recommend water and Gatorade.  If your symptoms persist please follow-up with your PCP.  If you develop chest pain, shortness of breath or any other concerning symptom please return emergency department further evaluation.

## 2023-07-27 NOTE — ED Provider Notes (Signed)
[place] EMERGENCY DEPARTMENT AT [facility] Provider Note   CSN: [id number] Arrival date & time: 07/27/23  1929     History  Chief Complaint  Patient presents with   Cough   Nasal Congestion   HPI Rachel [name] is a 32 y.o. female presenting for cough and congestion.  Started yesterday.  Denies fever at home.  Has started taking DayQuil and TheraFlu.  Denies chest pain, shortness of breath, and sore throat.  Concerned she may have COVID or the flu.   Cough      Home Medications Prior to Admission medications   Medication Sig Start Date End Date Taking? Authorizing Provider  amoxicillin (AMOXIL) 500 MG capsule Take 1 capsule (500 mg total) by mouth 3 (three) times daily. 07/26/19   Liberty Handy, PA-C  amoxicillin-clavulanate (AUGMENTIN) 875-125 MG tablet Take 1 tablet by mouth every 12 (twelve) hours. 09/20/22   Prosperi, Christian H, PA-C  naproxen (NAPROSYN) 500 MG tablet Take 1 tablet (500 mg total) by mouth 2 (two) times daily. 06/29/21   Ivette Loyal, NP      Allergies    Patient has no known allergies.    Review of Systems   Review of Systems  Respiratory:  Positive for cough.     Physical Exam   Vitals:   07/27/23 1957 07/27/23 2002  BP: (!) 145/67   Pulse: 85   Resp: 17   Temp: 99.5 F (37.5 C)   SpO2: 100% 100%    CONSTITUTIONAL:  well-appearing, NAD NEURO:  Alert and oriented x 3, CN 3-12 grossly intact EYES:  eyes equal and reactive ENT/NECK:  Supple, no stridor  CARDIO:  Regular rate and rhythm, appears well-perfused  PULM:  No respiratory distress, CTAB, no rales or wheezes GI/GU:  non-distended MSK/SPINE:  No gross deformities, no edema, moves all extremities  SKIN:  no rash, atraumatic  *Additional and/or pertinent findings included in MDM below  ED Results / Procedures / Treatments   Labs (all labs ordered are listed, but only abnormal results are displayed) Labs Reviewed  RESP PANEL BY RT-PCR (RSV, FLU  A&B, COVID)  RVPGX2    EKG None  Radiology DG Chest 2 View  Result Date: 07/27/2023 CLINICAL DATA:  Cough, congestion EXAM: CHEST - 2 VIEW COMPARISON:  09/20/2022 FINDINGS: Lungs are clear.  No pleural effusion or pneumothorax. The heart is normal in size. Visualized osseous structures are within normal limits. IMPRESSION: Normal chest radiographs. Electronically Signed   By: Charline Bills M.D.   On: 07/27/2023 20:45    Procedures Procedures    Medications Ordered in ED Medications  ibuprofen (ADVIL) tablet 800 mg (has no administration in time range)    ED Course/ Medical Decision Making/ A&P                                 Medical Decision Making Amount and/or Complexity of Data Reviewed Radiology: ordered.   32 year old well-appearing female presenting for cough and congestion.  Exam was unremarkable.  DDx includes COVID, flu, other URI, pneumonia.  Respiratory PCR was negative.  However symptoms are consistent with a likely viral URI.  Advised conservative treatment at home.  Discussed follow-up with PCP if her symptoms worsen.  Discussed pertinent return precautions.  Vital stable.  Discharged home in good condition.        Final Clinical Impression(s) / ED Diagnoses Final diagnoses:  Cough, unspecified type  Nasal  congestion    Rx / DC Orders ED Discharge Orders     None         Gareth Eagle, PA-C 07/27/23 2328    Arby Barrette, MD 08/03/23 [phone]

## 2023-12-28 DIAGNOSIS — Z01419 Encounter for gynecological examination (general) (routine) without abnormal findings: Secondary | ICD-10-CM | POA: Diagnosis not present

## 2023-12-28 DIAGNOSIS — Z3009 Encounter for other general counseling and advice on contraception: Secondary | ICD-10-CM | POA: Diagnosis not present

## 2024-01-11 ENCOUNTER — Other Ambulatory Visit: Payer: Self-pay

## 2024-01-11 DIAGNOSIS — N6322 Unspecified lump in the left breast, upper inner quadrant: Secondary | ICD-10-CM

## 2024-01-12 ENCOUNTER — Encounter: Payer: Self-pay | Admitting: Obstetrics & Gynecology

## 2024-01-13 ENCOUNTER — Other Ambulatory Visit: Payer: Self-pay

## 2024-01-13 ENCOUNTER — Emergency Department (HOSPITAL_BASED_OUTPATIENT_CLINIC_OR_DEPARTMENT_OTHER)
Admission: EM | Admit: 2024-01-13 | Discharge: 2024-01-14 | Disposition: A | Discharge status: Home / Self Care | Payer: Self-pay | Attending: Emergency Medicine | Admitting: Emergency Medicine

## 2024-01-13 DIAGNOSIS — Z20822 Contact with and (suspected) exposure to covid-19: Secondary | ICD-10-CM | POA: Insufficient documentation

## 2024-01-13 DIAGNOSIS — R42 Dizziness and giddiness: Secondary | ICD-10-CM | POA: Insufficient documentation

## 2024-01-13 DIAGNOSIS — I10 Essential (primary) hypertension: Secondary | ICD-10-CM | POA: Insufficient documentation

## 2024-01-13 LAB — CBG MONITORING, ED: Glucose-Capillary: 91 mg/dL (ref 70–99)

## 2024-01-13 NOTE — ED Triage Notes (Signed)
Pt POV from home reporting dizziness that began yesterday, denies blurred vision or pain. Denies congestion, fever, or SOB. Concerned about high BP, 170 sys last week.

## 2024-01-14 LAB — RESP PANEL BY RT-PCR (RSV, FLU A&B, COVID)  RVPGX2
Influenza A by PCR: NEGATIVE
Influenza B by PCR: NEGATIVE
Resp Syncytial Virus by PCR: NEGATIVE
SARS Coronavirus 2 by RT PCR: NEGATIVE

## 2024-01-14 NOTE — ED Provider Notes (Signed)
[place] EMERGENCY DEPARTMENT AT [facility] Provider Note   CSN: [id number] Arrival date & time: 01/13/24  1828     History  Chief Complaint  Patient presents with   Dizziness   Hypertension    Rachel [name] is a 33 y.o. female.  The history is provided by the patient.  Dizziness Quality:  Lightheadedness Severity:  Moderate Timing:  Constant Progression:  Resolved Context: not with loss of consciousness, not with medication and not when urinating   Relieved by:  Nothing Worsened by:  Nothing Ineffective treatments:  None tried Associated symptoms: no chest pain, no palpitations, no shortness of breath, no syncope and no weakness   Risk factors: no anemia and no hx of vertigo   Felt light headed after an elevated BP reading and thought it was related.  No associated symptoms.       Home Medications Prior to Admission medications   Medication Sig Start Date End Date Taking? Authorizing Provider  amoxicillin (AMOXIL) 500 MG capsule Take 1 capsule (500 mg total) by mouth 3 (three) times daily. 07/26/19   Liberty Handy, PA-C  amoxicillin-clavulanate (AUGMENTIN) 875-125 MG tablet Take 1 tablet by mouth every 12 (twelve) hours. 09/20/22   Prosperi, Christian H, PA-C  naproxen (NAPROSYN) 500 MG tablet Take 1 tablet (500 mg total) by mouth 2 (two) times daily. 06/29/21   Ivette Loyal, NP      Allergies    Patient has no known allergies.    Review of Systems   Review of Systems  Constitutional:  Negative for fever.  HENT:  Negative for facial swelling.   Respiratory:  Negative for shortness of breath.   Cardiovascular:  Negative for chest pain, palpitations and syncope.  Neurological:  Positive for light-headedness. Negative for tremors, syncope, facial asymmetry, speech difficulty and weakness.  All other systems reviewed and are negative.   Physical Exam Updated Vital Signs BP 102/61   Pulse (!) 46   Temp (!) 97.5 F (36.4 C)   Resp  19   Ht 5\' 3"  (1.6 m)   Wt 92.1 kg   LMP 01/04/2024 (Approximate)   SpO2 100%   BMI 35.96 kg/m  Physical Exam Vitals and nursing note reviewed.  Constitutional:      General: She is not in acute distress.    Appearance: She is well-developed.  HENT:     Head: Normocephalic and atraumatic.     Nose: Nose normal.     Mouth/Throat:     Mouth: Mucous membranes are moist.     Pharynx: Oropharynx is clear.  Eyes:     Extraocular Movements: Extraocular movements intact.     Pupils: Pupils are equal, round, and reactive to light.  Cardiovascular:     Rate and Rhythm: Normal rate and regular rhythm.     Pulses: Normal pulses.     Heart sounds: Normal heart sounds.  Pulmonary:     Effort: Pulmonary effort is normal. No respiratory distress.     Breath sounds: Normal breath sounds.  Abdominal:     General: Bowel sounds are normal. There is no distension.     Palpations: Abdomen is soft.     Tenderness: There is no abdominal tenderness. There is no guarding or rebound.  Musculoskeletal:        General: Normal range of motion.     Cervical back: Neck supple.  Skin:    General: Skin is dry.     Capillary Refill: Capillary refill takes less than  2 seconds.     Findings: No erythema or rash.  Neurological:     General: No focal deficit present.     Mental Status: She is oriented to person, place, and time.     Deep Tendon Reflexes: Reflexes normal.  Psychiatric:        Mood and Affect: Mood normal.        Behavior: Behavior normal.     ED Results / Procedures / Treatments   Labs (all labs ordered are listed, but only abnormal results are displayed) Labs Reviewed  RESP PANEL BY RT-PCR (RSV, FLU A&B, COVID)  RVPGX2  CBG MONITORING, ED    EKG None  Radiology No results found.  Procedures Procedures    Medications Ordered in ED Medications - No data to display  ED Course/ Medical Decision Making/ A&P                                 Medical Decision  Making Patient had an elevated BP reading at [facility] health department.  No associated symptoms   Amount and/or Complexity of Data Reviewed External Data Reviewed: notes.    Details: Previous notes reviewed  Labs: ordered.    Details: Negative covid and flu   Risk Risk Details: Well appearing.  Exam and vitals are benign and reassuring.  No elevated BP.  I suspect wrong cuff was used.  Drink copious fluids.  FOllow up with your PMD.  Stable for for discharge.  Strict returns     Final Clinical Impression(s) / ED Diagnoses Final diagnoses:  Light headedness   Return for intractable cough, coughing up blood, fevers > 100.4 unrelieved by medication, shortness of breath, intractable vomiting, chest pain, shortness of breath, weakness, numbness, changes in speech, facial asymmetry, abdominal pain, passing out, Inability to tolerate liquids or food, cough, altered mental status or any concerns. No signs of systemic illness or infection. The patient is nontoxic-appearing on exam and vital signs are within normal limits.  I have reviewed the triage vital signs and the nursing notes. Pertinent labs & imaging results that were available during my care of the patient were reviewed by me and considered in my medical decision making (see chart for details). After history, exam, and medical workup I feel the patient has been appropriately medically screened and is safe for discharge home. Pertinent diagnoses were discussed with the patient. Patient was given return precautions.  Rx / DC Orders ED Discharge Orders     None         Gerrard Crystal, MD 01/14/24 0028

## 2024-01-14 NOTE — Discharge Instructions (Signed)
Drink copious fluids.

## 2024-01-24 ENCOUNTER — Ambulatory Visit: Payer: Self-pay | Admitting: *Deleted

## 2024-01-24 ENCOUNTER — Ambulatory Visit
Admission: RE | Admit: 2024-01-24 | Discharge: 2024-01-24 | Disposition: A | Discharge status: Home / Self Care | Payer: Medicaid Other | Source: Ambulatory Visit | Attending: Obstetrics and Gynecology | Admitting: Obstetrics and Gynecology

## 2024-01-24 ENCOUNTER — Ambulatory Visit
Admission: RE | Admit: 2024-01-24 | Discharge: 2024-01-24 | Disposition: A | Discharge status: Home / Self Care | Payer: No Typology Code available for payment source | Source: Ambulatory Visit | Attending: Obstetrics and Gynecology | Admitting: Obstetrics and Gynecology

## 2024-01-24 VITALS — BP 123/66 | Ht 63.0 in

## 2024-01-24 DIAGNOSIS — N6322 Unspecified lump in the left breast, upper inner quadrant: Secondary | ICD-10-CM

## 2024-01-24 DIAGNOSIS — Z1239 Encounter for other screening for malignant neoplasm of breast: Secondary | ICD-10-CM

## 2024-01-24 NOTE — Progress Notes (Signed)
Rachel [name] is a 33 y.o. female who presents to [facility] clinic today with complaint of left breast lump x 2-3 months and pain. Patient states the pain comes and goes. Patient rates the pain at a 2 out of 10.    Pap Smear: Pap smear not completed today. Last Pap smear was 12/28/2023 at the [facility] clinic and was normal with negative HPV. Per patient has no history of an abnormal Pap smear. Last Pap smear result is available in Epic.   Physical exam: Breasts Left breast slightly larger than right breast that per patient is normal for her. No skin abnormalities bilateral breasts. No nipple retraction bilateral breasts. No nipple discharge bilateral breasts. No lymphadenopathy. No lumps palpated right breast. Palpated a pea sized lump within the left breast at 9 o'clock 5 cm from the nipple. Complaints of tenderness when palpated left breast lump.   Pelvic/Bimanual Pap is not indicated today per BCCCP guidelines.   Smoking History: Patient is a former smoker that quit in 2012.   Patient Navigation: Patient education provided. Access to services provided for patient through BCCCP program.   Breast and Cervical Cancer Risk Assessment: Patient has family history of mother and sister having breast cancer. Patient has no known genetic mutations or history of radiation treatment to the chest before age 62. Patient does not have history of cervical dysplasia, immunocompromised, or DES exposure in-utero. Breast cancer risk assessment completed. No breast cancer risk calculated due to patient is less than 38 years old.  Risk Assessment   No risk assessment data     A: BCCCP exam without pap smear Complaint of left breast lump  P: Referred patient to the [facility] for a diagnostic mammogram. Appointment scheduled Tuesday, January 24, 2024 at 1240.  Priscille Heidelberg, RN 01/24/2024 9:29 AM

## 2024-01-24 NOTE — Patient Instructions (Signed)
Explained breast self awareness with [employer]. Patient did not need a Pap smear today due to last Pap smear and HPV typing was 12/28/2023. Let her know BCCCP will cover Pap smears and HPV every 5 years unless has a history of abnormal Pap smears. Referred patient to the [facility] for a diagnostic mammogram. Appointment scheduled Tuesday, January 24, 2024 at 1240. Patient aware of appointment and will be there. Rachel [name] verbalized understanding.  Cheyann Blecha, Kathaleen Maser, RN 9:29 AM

## 2024-07-11 ENCOUNTER — Encounter: Payer: Self-pay | Admitting: *Deleted

## 2024-07-19 ENCOUNTER — Other Ambulatory Visit (HOSPITAL_COMMUNITY)
Admission: RE | Admit: 2024-07-19 | Discharge: 2024-07-19 | Disposition: A | Discharge status: Home / Self Care | Source: Ambulatory Visit | Attending: Family | Admitting: Family

## 2024-07-19 ENCOUNTER — Encounter (INDEPENDENT_AMBULATORY_CARE_PROVIDER_SITE_OTHER): Payer: Self-pay

## 2024-07-19 ENCOUNTER — Ambulatory Visit: Admitting: Family

## 2024-07-19 ENCOUNTER — Encounter: Payer: Self-pay | Admitting: Family

## 2024-07-19 VITALS — BP 138/83 | HR 60 | Temp 99.1°F | Resp 16 | Ht 63.0 in | Wt 203.8 lb

## 2024-07-19 DIAGNOSIS — Z6836 Body mass index (BMI) 36.0-36.9, adult: Secondary | ICD-10-CM

## 2024-07-19 DIAGNOSIS — Z124 Encounter for screening for malignant neoplasm of cervix: Secondary | ICD-10-CM

## 2024-07-19 DIAGNOSIS — L918 Other hypertrophic disorders of the skin: Secondary | ICD-10-CM

## 2024-07-19 DIAGNOSIS — G4700 Insomnia, unspecified: Secondary | ICD-10-CM | POA: Diagnosis not present

## 2024-07-19 DIAGNOSIS — R635 Abnormal weight gain: Secondary | ICD-10-CM | POA: Diagnosis not present

## 2024-07-19 DIAGNOSIS — Z7689 Persons encountering health services in other specified circumstances: Secondary | ICD-10-CM

## 2024-07-19 DIAGNOSIS — N898 Other specified noninflammatory disorders of vagina: Secondary | ICD-10-CM

## 2024-07-19 MED ORDER — TRAZODONE HCL 50 MG PO TABS: 25.0000 mg | ORAL_TABLET | Freq: Every evening | ORAL | 0 refills | Status: DC | PRN

## 2024-07-19 MED ORDER — PHENTERMINE HCL 15 MG PO CAPS: 15.0000 mg | ORAL_CAPSULE | ORAL | 0 refills | Status: DC

## 2024-07-19 NOTE — Progress Notes (Signed)
 Subjective:    Rachel [name] - 33 y.o. female MRN [id number]  Date of birth: 1991/09/27  HPI  Rachel [name] is to establish care.   Current issues and/or concerns: - Weight gain. She would like to lose 20 to 30 pounds. She does not watch what she eats. She exercises. She would like to try weight loss pill and referral to weight specialist.  - States difficulty staying asleep during nighttime. States usually able to stay asleep for a few hours. Reports she works several jobs which may be contributing.  - Skin tag right thigh for 1 year and increasing in size. Denies red flag symptoms. Requests referral to Dermatology.  - Requests referral to Gynecology for women's health maintenance/screenings.  - Vaginal odor. Denies additional symptoms.    ROS per HPI   Health Maintenance:  Health Maintenance Due  Topic Date Due   INFLUENZA VACCINE  07/13/2024     Past Medical History: Patient Active Problem List   Diagnosis Date Noted   Genetic testing 12/02/2017   Family history of cancer    History of gestational diabetes 11/23/2017   H/O: C-section 11/07/2017      Social History   reports that she quit smoking about 12 years ago. Her smoking use included cigarettes. She has never used smokeless tobacco. She reports current alcohol use. She reports that she does not use drugs.   Family History  family history includes Asthma in her brother and sister; Breast cancer (age of onset: 71) in her mother; Cancer in her paternal grandmother and another family member; Colon polyps in her father and another family member; Diabetes in her father; Heart disease in her father; Hypertension in her father and mother; Kidney disease in her father; Lupus in her cousin and sister; Miscarriages / Stillbirths in her mother; Stroke in her father.   Medications: reviewed and updated   Objective:   Physical Exam BP 138/83   Pulse 60   Temp 99.1 F (37.3 C) (Oral)   Resp 16    Ht 5' 3 (1.6 m)   Wt 203 lb 12.8 oz (92.4 kg)   LMP 06/29/2024 (Approximate)   SpO2 97%   BMI 36.10 kg/m   Wt Readings from Last 3 Encounters:  07/19/24 203 lb 12.8 oz (92.4 kg)  01/13/24 203 lb (92.1 kg)  07/27/23 215 lb (97.5 kg)    Physical Exam HENT:     Head: Normocephalic and atraumatic.     Nose: Nose normal.     Mouth/Throat:     Mouth: Mucous membranes are moist.     Pharynx: Oropharynx is clear.  Eyes:     Extraocular Movements: Extraocular movements intact.     Conjunctiva/sclera: Conjunctivae normal.     Pupils: Pupils are equal, round, and reactive to light.  Cardiovascular:     Rate and Rhythm: Normal rate and regular rhythm.     Pulses: Normal pulses.     Heart sounds: Normal heart sounds.  Pulmonary:     Effort: Pulmonary effort is normal.     Breath sounds: Normal breath sounds.  Musculoskeletal:        General: Normal range of motion.     Cervical back: Normal range of motion and neck supple.  Skin:    General: Skin is warm and dry.     Comments: Skin tag right thigh.  Neurological:     General: No focal deficit present.     Mental Status: She is alert and oriented to person, place,  and time.  Psychiatric:        Mood and Affect: Mood normal.        Behavior: Behavior normal.     Assessment & Plan:  1. Encounter to establish care (Primary) - Patient presents today to establish care. During the interim follow-up with primary provider as scheduled.  - Return for annual physical examination, labs, and health maintenance. Arrive fasting meaning having no food for at least 8 hours prior to appointment. You may have only water or black coffee. Please take scheduled medications as normal.  2. Encounter for weight management 3. BMI 36.0-36.9,adult 4. Weight gain - Phentermine  as prescribed. Counseled on medication adherence and adverse effects. - I did check the [place]  prescription drug database . - Counseled on low-sodium DASH diet and 150  minutes of moderate intensity exercise per week as tolerated to assist with weight management.  - Referral to Medical Weight Management for evaluation/management.  - Follow-up with primary provider as scheduled.  - phentermine  15 MG capsule; Take 1 capsule (15 mg total) by mouth every morning.  Dispense: 30 capsule; Refill: 0 - Amb Ref to Medical Weight Management  5. Insomnia, unspecified type - Trazodone  as prescribed. Counseled on medication adherence/adverse effects.  - Follow-up with primary provider as scheduled. - traZODone  (DESYREL ) 50 MG tablet; Take 0.5-1 tablets (25-50 mg total) by mouth at bedtime as needed.  Dispense: 90 tablet; Refill: 0  6. Skin tag - Referral to Dermatology for evaluation/management. - Ambulatory referral to Dermatology  7. Cervical cancer screening - Referral to Gynecology for evaluation/management. - Ambulatory referral to Gynecology  8. Vaginal odor - Routine screening.  - Cervicovaginal ancillary only   Patient was given clear instructions to go to Emergency Department or return to medical center if symptoms don't improve, worsen, or new problems develop.The patient verbalized understanding.  I discussed the assessment and treatment plan with the patient. The patient was provided an opportunity to ask questions and all were answered. The patient agreed with the plan and demonstrated an understanding of the instructions.   The patient was advised to call back or seek an in-person evaluation if the symptoms worsen or if the condition fails to improve as anticipated.    Greig Drones, NP 07/19/2024, 11:42 AM Primary Care at [facility]

## 2024-07-19 NOTE — Progress Notes (Signed)
 Weight, dermatology referral, patient is sleepy and tired all the time, referral to a OBGYN, skin tag on the enter thigh that is really bothering her

## 2024-07-20 ENCOUNTER — Ambulatory Visit: Payer: Self-pay | Admitting: Family

## 2024-07-20 DIAGNOSIS — B9689 Other specified bacterial agents as the cause of diseases classified elsewhere: Secondary | ICD-10-CM

## 2024-07-20 LAB — CERVICOVAGINAL ANCILLARY ONLY
Bacterial Vaginitis (gardnerella): POSITIVE — AB
Candida Glabrata: NEGATIVE
Candida Vaginitis: NEGATIVE
Chlamydia: NEGATIVE
Comment: NEGATIVE
Comment: NEGATIVE
Comment: NEGATIVE
Comment: NEGATIVE
Comment: NEGATIVE
Comment: NORMAL
Neisseria Gonorrhea: NEGATIVE
Trichomonas: NEGATIVE

## 2024-07-20 MED ORDER — METRONIDAZOLE 500 MG PO TABS: 500.0000 mg | ORAL_TABLET | Freq: Two times a day (BID) | ORAL | 0 refills | Status: AC

## 2024-07-24 ENCOUNTER — Ambulatory Visit: Admitting: Family

## 2024-07-24 DIAGNOSIS — L918 Other hypertrophic disorders of the skin: Secondary | ICD-10-CM | POA: Diagnosis not present

## 2024-07-24 DIAGNOSIS — L299 Pruritus, unspecified: Secondary | ICD-10-CM | POA: Diagnosis not present

## 2024-07-24 DIAGNOSIS — L853 Xerosis cutis: Secondary | ICD-10-CM | POA: Diagnosis not present

## 2024-08-08 ENCOUNTER — Institutional Professional Consult (permissible substitution) (INDEPENDENT_AMBULATORY_CARE_PROVIDER_SITE_OTHER): Payer: Self-pay | Admitting: Nurse Practitioner

## 2024-08-08 NOTE — Progress Notes (Deleted)
 [address] [place], [place], [place] [address] Office: [phone]  /  Fax: [phone]   Initial Consultation    Rachel [name] was seen in clinic today to evaluate for obesity. She is interested in losing weight to improve overall health and reduce the risk of weight related complications. She presents today to review program treatment options, initial physical assessment, and evaluation.    Anthropometrics and Bioimpedance Analysis   There is no height or weight on file to calculate BMI. Body Fat Mass : *** % Visceral Fat Mass Rating : *** Waist to Height Ratio: ***  Obesity Related Diseases and Complications  Obesity Quality of Life and Psychosocial Complications: {emqolpsychosoc:33006::Reduced health-related quality of life}  Cardiometabolic: {emcardiometabolic complications:33007}  Biomechanical: {embiomechanical:33008}   Weight Related History  She was referred by: {emreferby:28303}  When asked what they would like to accomplish? She states: {EMHopetoaccomplish:28304::Adopt a healthier eating pattern and lifestyle,Improve energy levels and physical activity,Improve existing medical conditions,Improve quality of life}  Weight history: ***  Highest weight: ***  Contributing factors: {EMcontributingfactors:28307}  Prior weight loss attempts: {emweightlossprograms:31590::None}  Current or previous pharmacotherapy: {EM previousRx:28311}  Response to medication: {EMResponsetomedication:28312}  Current nutrition plan: {EMNutritionplan:28309::None}  Greatest challenge with dieting: {emgreatestchallengediet:31593}.  Current level of physical activity: {EMcurrentPA:28310::None}  Barriers to Exercise: {embarrierstoexercise:32606::no barriers}  Readiness and Motivation  On a scale from 0 to 10 How ready are you to make changes to your eating and physical activity to lose weight? {NUMBER 1-10:22536} How important is it for you to lose  weight right now ? {NUMBER 1-10:22536} How confident are you that you can lose weight if you try? 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    General:  Alert, oriented and cooperative. Patient is in no acute distress.  Respiratory: Normal respiratory effort, no problems with respiration noted   Gait: able to ambulate independently  Mental Status: Normal mood and affect. Normal behavior. Normal judgment and thought content.   Diagnostic Data Reviewed  BMET    Component Value Date/Time   NA 140 12/30/2017 1144   K 3.9 12/30/2017 1144   CL 107 (H) 12/30/2017 1144   CO2 21 11/23/2017 1519   GLUCOSE 124 (H) 12/30/2017 1144   GLUCOSE 94 04/14/2010 0933   BUN 6 12/30/2017 1144   CREATININE 0.60 02/11/2018 0552   CALCIUM  9.4 12/30/2017 1144   GFRNONAA >60 02/11/2018 0552   GFRAA >60 02/11/2018 0552   No results found for: HGBA1C No results found for: INSULIN CBC    Component Value Date/Time   WBC 8.3 03/29/2018 1339   WBC 8.7 02/12/2018  0523   RBC 5.30 (H) 03/29/2018 1339   RBC 3.66 (L) 02/12/2018 0523   HGB 13.8 03/29/2018 1339   HGB 10.9 04/05/2012 0000   HCT 41.8 03/29/2018 1339   HCT 35 04/05/2012 0000   PLT 314  03/29/2018 1339   PLT 251 12/29/2011 0000   MCV 79 03/29/2018 1339   MCH 26.0 (L) 03/29/2018 1339   MCH 27.0 02/12/2018 0523   MCHC 33.0 03/29/2018 1339   MCHC 32.8 02/12/2018 0523   RDW 16.3 (H) 03/29/2018 1339   Iron/TIBC/Ferritin/ %Sat No results found for: IRON, TIBC, FERRITIN, IRONPCTSAT Lipid Panel  No results found for: CHOL, TRIG, HDL, CHOLHDL, VLDL, LDLCALC, LDLDIRECT Hepatic Function Panel     Component Value Date/Time   PROT 6.0 12/30/2017 1144   ALBUMIN 3.5 12/30/2017 1144   AST 15 12/30/2017 1144   ALT 18 11/23/2017 1519   ALKPHOS 100 12/30/2017 1144   BILITOT <0.2 12/30/2017 1144      Component Value Date/Time   TSH 1.003 12/10/2010 1735    Medications  Outpatient Encounter Medications as of 08/08/2024  Medication Sig   phentermine  15 MG capsule Take 1 capsule (15 mg total) by mouth every morning.   traZODone  (DESYREL ) 50 MG tablet Take 0.5-1 tablets (25-50 mg total) by mouth at bedtime as needed.   No facility-administered encounter medications on file as of 08/08/2024.     Assessment and Plan   History of gestational diabetes  Class 2 obesity due to excess calories without serious comorbidity with body mass index (BMI) of 36.0 to 36.9 in adult       Obesity Treatment and Action Plan:  {EMobesityactionplanscribe:28314::Patient will work on garnering support from family and friends to begin weight loss journey.,Will work on eliminating or reducing the presence of highly palatable, calorie dense foods in the home.,Will complete provided nutritional and psychosocial assessment questionnaire before the next appointment.,Will be scheduled for indirect calorimetry to determine resting energy expenditure in a fasting state.  This will allow us  to create a reduced calorie, high-protein meal plan to promote loss of fat mass while preserving muscle mass.,Counseled on the health benefits of losing 5%-15% of total body weight.,Was  counseled on nutritional approaches to weight loss and benefits of reducing processed foods and consuming plant-based foods and high quality protein as part of nutritional weight management.,Was counseled on pharmacotherapy and role as an adjunct in weight management. }  Education and Additional resources  She was weighed on the bioimpedance scale and results were discussed and documented in the synopsis.  We discussed obesity as a progressive, chronic disease and the importance of a more detailed evaluation of all the factors contributing to the disease.  We reviewed the basic principles in obesity management.   We discussed the importance of long term lifestyle changes which include nutrition, exercise and behavioral modification as well as the importance of customizing this to her specific health and social needs.  We reviewed the role of medical interventions including pharmacotherapy and surgical interventions.   We discussed the benefits of reaching a healthier weight to alleviate the symptoms of existing conditions and reduce the risks of the biomechanical, cardiometabolic and psychological effects of obesity.  We reviewed our program approach and philosophy, which are guided by the four pillars of obesity medicine.  We discussed how to prepare for intake appointment and the importance of fasting and avoidance of stimulants for at least 8 hours prior to indirect calorimetry.  Rachel [name] appears to be in the action  stage of change and reports being ready to initiate intensive lifestyle and behavioral modifications as part of their weight loss journey.  Attestation  Reviewed by clinician on day of visit: allergies, medications, problem list, medical history, surgical history, family history, social history, and previous encounter notes pertinent to obesity diagnosis.  I have spent *** minutes in the care of the patient today including: {NUMBER 1-10:22536} minutes  before the visit reviewing and preparing the chart. *** minutes face-to-face {emfacetoface:32598::assessing and reviewing listed medical problems as outlined in obesity care plan,providing nutritional and behavioral counseling on topics outlined in the obesity care plan,independently interpreting test results and goals of care, as described in assessment and plan,reviewing and discussing biometric information and progress} {NUMBER 1-10:22536} minutes after the visit updating chart and documentation of encounter.  Hoy Fallert ANP-C

## 2024-09-06 ENCOUNTER — Ambulatory Visit: Admitting: Family

## 2024-09-06 ENCOUNTER — Other Ambulatory Visit: Payer: Self-pay | Admitting: Family

## 2024-09-06 ENCOUNTER — Encounter: Payer: Self-pay | Admitting: Family

## 2024-09-06 VITALS — BP 92/61 | HR 80 | Temp 97.8°F | Resp 16 | Ht 63.0 in | Wt 205.6 lb

## 2024-09-06 DIAGNOSIS — R635 Abnormal weight gain: Secondary | ICD-10-CM

## 2024-09-06 DIAGNOSIS — G4700 Insomnia, unspecified: Secondary | ICD-10-CM

## 2024-09-06 DIAGNOSIS — Z760 Encounter for issue of repeat prescription: Secondary | ICD-10-CM

## 2024-09-06 DIAGNOSIS — Z130 Encounter for screening for diseases of the blood and blood-forming organs and certain disorders involving the immune mechanism: Secondary | ICD-10-CM | POA: Diagnosis not present

## 2024-09-06 DIAGNOSIS — L918 Other hypertrophic disorders of the skin: Secondary | ICD-10-CM

## 2024-09-06 DIAGNOSIS — Z7689 Persons encountering health services in other specified circumstances: Secondary | ICD-10-CM | POA: Diagnosis not present

## 2024-09-06 DIAGNOSIS — Z6836 Body mass index (BMI) 36.0-36.9, adult: Secondary | ICD-10-CM

## 2024-09-06 MED ORDER — PHENTERMINE HCL 30 MG PO CAPS: 30.0000 mg | ORAL_CAPSULE | ORAL | 0 refills | Status: DC

## 2024-09-06 NOTE — Progress Notes (Signed)
 Patient ID: Rachel [name], female    DOB: 1991/11/16  MRN: [id number]  CC: Chronic Conditions Follow-Up  Subjective: Rachel [name] is a 33 y.o. female who presents for chronic conditions follow-up.   Her concerns today include:  - States Phentermine  is not helping as much as she would like. States [facility] went to [facility] and was told she has to pay $100 for each appointment.  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 Denies injury. States the client told her employer and she lost her job. States she would like referral to sleep specialist.  - Anemia/iron lab.   Patient Active Problem List   Diagnosis Date Noted   Genetic testing 12/02/2017   Family history of cancer    History of gestational diabetes 11/23/2017   H/O: C-section 11/07/2017     Current Outpatient Medications on File Prior to Visit  Medication Sig Dispense Refill   traZODone  (DESYREL ) 50 MG tablet Take 0.5-1 tablets (25-50 mg total) by mouth at bedtime as needed. 90 tablet 0   No current facility-administered medications on file prior to visit.    No Known Allergies  Social History   Socioeconomic History   Marital status: Single    Spouse name: Not on file   Number of children: Not on file   Years of education: 14   Highest education level: Associate degree: academic program  Occupational History   Occupation: Investment banker, corporate: [employer]  Tobacco Use   Smoking status: Former    Current packs/day: 0.00    Types: Cigarettes    Quit date: 11/02/2011    Years since quitting: 12.8   Smokeless tobacco: Never  Vaping Use   Vaping status: Never Used  Substance and Sexual Activity   Alcohol use: Yes    Comment: occ.   Drug use: No   Sexual activity: Yes    Partners: Male    Birth control/protection: None  Other Topics Concern   Not on file  Social History Narrative   Not on file   Social Drivers of Health   Financial Resource  Strain: Medium Risk (09/04/2024)   Overall Financial Resource Strain (CARDIA)    Difficulty of Paying Living Expenses: Somewhat hard  Food Insecurity: Patient Declined (09/04/2024)   Hunger Vital Sign    Worried About Running Out of Food in the Last Year: Patient declined    Ran Out of Food in the Last Year: Patient declined  Transportation Needs: No Transportation Needs (09/04/2024)   PRAPARE - Administrator, Civil Service (Medical): No    Lack of Transportation (Non-Medical): No  Physical Activity: Sufficiently Active (09/04/2024)   Exercise Vital Sign    Days of Exercise per Week: 6 days    Minutes of Exercise per Session: 40 min  Stress: Stress Concern Present (09/04/2024)   [employer] of Occupational Health - Occupational Stress Questionnaire    Feeling of Stress: To some extent  Social Connections: Moderately Isolated (09/04/2024)   Social Connection and Isolation Panel    Frequency of Communication with Friends and Family: Once a week    Frequency of Social Gatherings with Friends and Family: Once a week    Attends Religious Services: 1 to 4 times per year    Active Member of [employer] or Organizations: Yes    Attends Banker Meetings: 1 to 4 times per year    Marital Status: Never married  Catering manager  Violence: Not At Risk (07/19/2024)   Humiliation, Afraid, Rape, and Kick questionnaire    Fear of Current or Ex-Partner: No    Emotionally Abused: No    Physically Abused: No    Sexually Abused: No    Family History  Problem Relation Age of Onset   Hypertension Mother    Miscarriages / Stillbirths Mother    Breast cancer Mother 70   Hypertension Father    Diabetes Father    Heart disease Father    Kidney disease Father        MASS; KIDNEY REMOVED   Stroke Father    Colon polyps Father        #/type unknown   Asthma Sister    Lupus Sister    Cancer Paternal Grandmother        colon; deceased 59   Lupus Cousin    Asthma Brother     Cancer Other        paternal half-sister; stomach ca; deceased 33   Colon polyps Other        paternal half-brother; #/type unknown   Anesthesia problems Neg Hx    Hypotension Neg Hx    Malignant hyperthermia Neg Hx    Pseudochol deficiency Neg Hx     Past Surgical History:  Procedure Laterality Date   CESAREAN SECTION  07/07/2012   Procedure: CESAREAN SECTION;  Surgeon: Jon CINDERELLA Rummer, MD;  Location: [facility];  Service: Gynecology;  Laterality: N/A;   CESAREAN SECTION N/A 02/10/2018   Procedure: CESAREAN SECTION;  Surgeon: Fredirick Glenys RAMAN, MD;  Location: [facility];  Service: Obstetrics;  Laterality: N/A;   WISDOM TOOTH EXTRACTION     AGE 34    ROS: Review of Systems Negative except as stated above  PHYSICAL EXAM: BP 92/61   Pulse 80   Temp 97.8 F (36.6 C) (Oral)   Resp 16   Ht 5' 3 (1.6 m)   Wt 205 lb 9.6 oz (93.3 kg)   LMP 08/26/2024 (Approximate)   SpO2 97%   BMI 36.42 kg/m   Wt Readings from Last 3 Encounters:  09/06/24 205 lb 9.6 oz (93.3 kg)  07/19/24 203 lb 12.8 oz (92.4 kg)  01/13/24 203 lb (92.1 kg)   Physical Exam HENT:     Head: Normocephalic and atraumatic.     Nose: Nose normal.     Mouth/Throat:     Mouth: Mucous membranes are moist.     Pharynx: Oropharynx is clear.  Eyes:     Extraocular Movements: Extraocular movements intact.     Conjunctiva/sclera: Conjunctivae normal.     Pupils: Pupils are equal, round, and reactive to light.  Cardiovascular:     Rate and Rhythm: Normal rate and regular rhythm.     Pulses: Normal pulses.     Heart sounds: Normal heart sounds.  Pulmonary:     Effort: Pulmonary effort is normal.     Breath sounds: Normal breath sounds.  Musculoskeletal:        General: Normal range of motion.     Cervical back: Normal range of motion and neck supple.  Neurological:     General: No focal deficit present.     Mental Status: She is alert and oriented to person, place, and time.  Psychiatric:        Mood and  Affect: Mood normal.        Behavior: Behavior normal.     ASSESSMENT AND PLAN: 1. Encounter for weight management (Primary) 2.  BMI 36.0-36.9,adult 3. Weight gain - Patient gained 2 pounds since previous office visit.  - Increase Phentermine  from 15 mg to 30 mg as prescribed. Counseled on medication adherence/adverse effects.  - Follow-up with primary provider in 4 weeks or sooner if needed.  - phentermine  30 MG capsule; Take 1 capsule (30 mg total) by mouth every morning.  Dispense: 30 capsule; Refill: 0  4. Insomnia, unspecified type - Routine screening.  - Referral to Sleep Studies for evaluation/management.  - PSG Sleep Study - Ambulatory referral to Sleep Studies  5. Screening for deficiency anemia - Routine screening.  - CBC; Future - Iron, TIBC and Ferritin Panel; Future   Patient was given the opportunity to ask questions.  Patient verbalized understanding of the plan and was able to repeat key elements of the plan. Patient was given clear instructions to go to Emergency Department or return to medical center if symptoms don't improve, worsen, or new problems develop.The patient verbalized understanding.   Orders Placed This Encounter  Procedures   CBC   Iron, TIBC and Ferritin Panel   Ambulatory referral to Sleep Studies   PSG Sleep Study     Requested Prescriptions   Signed Prescriptions Disp Refills   phentermine  30 MG capsule 30 capsule 0    Sig: Take 1 capsule (30 mg total) by mouth every morning.    Return in 4 weeks (on 10/04/2024) for Follow-Up or next available weight check.  Greig JINNY Drones, NP

## 2024-09-06 NOTE — Progress Notes (Signed)
 One month follow up, patient has been sleeping a lot and wants her iron check, patient does not like the weight loss medication

## 2024-09-11 DIAGNOSIS — Z0120 Encounter for dental examination and cleaning without abnormal findings: Secondary | ICD-10-CM | POA: Diagnosis not present

## 2024-09-12 ENCOUNTER — Telehealth: Payer: Self-pay

## 2024-09-12 ENCOUNTER — Other Ambulatory Visit: Payer: Self-pay | Admitting: Family

## 2024-09-12 DIAGNOSIS — R0683 Snoring: Secondary | ICD-10-CM

## 2024-09-12 NOTE — Telephone Encounter (Signed)
 I called patient and made them aware that sleep study has been ordered

## 2024-09-12 NOTE — Telephone Encounter (Signed)
 I called patient and made this aware that sleep study has been ordered

## 2024-09-12 NOTE — Telephone Encounter (Signed)
 Copied from CRM [id number]. Topic: Clinical - Medical Advice >> Sep 11, 2024  3:29 PM Emylou G wrote: Reason for CRM: Patient called.. said dentist was advising to get tested for sleep apnie.. can we do this?

## 2024-09-12 NOTE — Telephone Encounter (Signed)
 PSG Sleep Study ordered.

## 2024-09-12 NOTE — Telephone Encounter (Signed)
 PSG Sleep Study ordered 09/12/24 1232.

## 2024-09-13 ENCOUNTER — Other Ambulatory Visit

## 2024-09-13 DIAGNOSIS — Z130 Encounter for screening for diseases of the blood and blood-forming organs and certain disorders involving the immune mechanism: Secondary | ICD-10-CM | POA: Diagnosis not present

## 2024-09-14 ENCOUNTER — Ambulatory Visit: Payer: Self-pay | Admitting: Family Medicine

## 2024-09-14 LAB — IRON,TIBC AND FERRITIN PANEL
Ferritin: 28 ng/mL (ref 15–150)
Iron Saturation: 51 % (ref 15–55)
Iron: 196 ug/dL — ABNORMAL HIGH (ref 27–159)
Total Iron Binding Capacity: 387 ug/dL (ref 250–450)
UIBC: 191 ug/dL (ref 131–425)

## 2024-09-14 LAB — CBC
Hematocrit: 45.8 % (ref 34.0–46.6)
Hemoglobin: 14 g/dL (ref 11.1–15.9)
MCH: 25.9 pg — ABNORMAL LOW (ref 26.6–33.0)
MCHC: 30.6 g/dL — ABNORMAL LOW (ref 31.5–35.7)
MCV: 85 fL (ref 79–97)
Platelets: 342 x10E3/uL (ref 150–450)
RBC: 5.41 x10E6/uL — ABNORMAL HIGH (ref 3.77–5.28)
RDW: 14.3 % (ref 11.7–15.4)
WBC: 8.9 x10E3/uL (ref 3.4–10.8)

## 2024-10-08 ENCOUNTER — Telehealth: Payer: Self-pay | Admitting: Emergency Medicine

## 2024-10-08 NOTE — Telephone Encounter (Signed)
 Report to the Emergency Department/Urgent Care/call 911 for immediate medical evaluation. Follow-up with Primary Care.

## 2024-10-08 NOTE — Telephone Encounter (Signed)
 I  returned patient call and she stated she already received information she needed

## 2024-10-08 NOTE — Telephone Encounter (Signed)
 Copied from CRM [id number]. Topic: General - Other >> Oct 05, 2024  8:25 AM Pinkey ORN wrote: Reason for CRM: General Concerns

## 2024-11-02 ENCOUNTER — Ambulatory Visit: Payer: Self-pay

## 2024-11-02 NOTE — Telephone Encounter (Signed)
 noted

## 2024-11-02 NOTE — Telephone Encounter (Signed)
 FYI Only or Action Required?: FYI only for provider: appointment scheduled on 11/19/24.  Patient was last seen in primary care on 09/06/2024 by Jaycee Greig PARAS, NP.  Called Nurse Triage reporting Fatigue, Cold Extremity, and Numbness.  Symptoms began several months ago.  Interventions attempted: Rest, hydration, or home remedies.  Symptoms are: gradually worsening.  Triage Disposition: See PCP Within 2 Weeks  Patient/caregiver understands and will follow disposition?: Yes          Copied from CRM [id number]. Topic: Clinical - Red Word Triage >> Nov 02, 2024  3:11 PM Rachel [name] wrote: [facility] that prompted transfer to Nurse Triage: Checkup on thyroid - cold feet.Rachel [name] extreme fatigue.. hand and arm are going numb left side Reason for Disposition  Weakness is a chronic symptom (recurrent or ongoing AND present > 4 weeks)    PCP with no access, scheduled within [facility].  Answer Assessment - Initial Assessment Questions 1. DESCRIPTION: Describe how you are feeling.     Generalized fatigue - has concern for thyroid issues 2. SEVERITY: How bad is it?  Can you stand and walk?     Yes is able to stand/walk 3. ONSET: When did these symptoms begin? (e.[name]., hours, days, weeks, months)     A few months ago - around April/May 4. CAUSE: What do you think is causing the weakness or fatigue? (e.[name]., not drinking enough fluids, medical problem, trouble sleeping)     unknown 5. NEW MEDICINES:  Have you started on any new medicines recently? (e.[name]., opioid pain medicines, benzodiazepines, muscle relaxants, antidepressants, antihistamines, neuroleptics, beta blockers)     N/a 6. OTHER SYMPTOMS: Do you have any other symptoms? (e.[name]., chest pain, fever, cough, SOB, vomiting, diarrhea, bleeding, other areas of pain)     L hand/arm numbness/tingling x 1 month - mainly at night, but now noticing during day. Cool feet 7. PREGNANCY: Is there any chance you are pregnant? When was  your last menstrual period?     N/a  Protocols used: Weakness (Generalized) and Fatigue-A-AH

## 2024-11-12 ENCOUNTER — Ambulatory Visit: Admitting: Obstetrics and Gynecology

## 2024-11-12 ENCOUNTER — Encounter: Payer: Self-pay | Admitting: Obstetrics and Gynecology

## 2024-11-12 ENCOUNTER — Other Ambulatory Visit (HOSPITAL_COMMUNITY)
Admission: RE | Admit: 2024-11-12 | Discharge: 2024-11-12 | Disposition: A | Discharge status: Home / Self Care | Source: Ambulatory Visit | Attending: Obstetrics and Gynecology | Admitting: Obstetrics and Gynecology

## 2024-11-12 VITALS — BP 138/83 | HR 88 | Wt 210.0 lb

## 2024-11-12 DIAGNOSIS — Z01419 Encounter for gynecological examination (general) (routine) without abnormal findings: Secondary | ICD-10-CM

## 2024-11-12 DIAGNOSIS — N939 Abnormal uterine and vaginal bleeding, unspecified: Secondary | ICD-10-CM | POA: Diagnosis not present

## 2024-11-12 DIAGNOSIS — Z3202 Encounter for pregnancy test, result negative: Secondary | ICD-10-CM

## 2024-11-12 DIAGNOSIS — N898 Other specified noninflammatory disorders of vagina: Secondary | ICD-10-CM | POA: Insufficient documentation

## 2024-11-12 LAB — POCT URINE PREGNANCY: Preg Test, Ur: NEGATIVE

## 2024-11-12 NOTE — Patient Instructions (Signed)
 It was nice meeting you today! You will see your results in the MyChart app within 1 week  Avoid: - Synthetic underwear - Tight pants - Swim suits, thongs, leotards, leggings for prolonged periods of time - Scented soap/shampoo - Bubble baths - Scented detergents, dryer sheets - Baby wipes - Feminine sprays, douches, powders - Panty liners - Dyed toilet paper - Shaving  Trying swapping out the above for: - Cotton or no underwear - Loose pants, skirts, dresses - Changing out of swimwear, thongs, and workout gear as soon as you're done exercising - Fragrance free soaps (like Dove sensitive skin) - Warm plain water baths - Unscented laundry detergent - Use a bedet or peri bottle to rinse instead of baby wipes - Tampons, cotton pads, cotton period underwear - Undyed toilet paper - Clipping hair

## 2024-11-12 NOTE — Progress Notes (Signed)
 ANNUAL EXAM Patient name: Rachel [name] MRN [id number]  Date of birth: 1991/04/17 Chief Complaint:   Gynecologic Exam  History of Present Illness:   Kurt Azimi is a 33 y.o. [id number] with Patient's last menstrual period was 11/01/2024 (approximate). being seen today for a routine annual exam.  Current complaints:   AUB - getting cycles q2 wk for the past 3 months. Recently just had a normal 28d cycle. Longstanding painful heavy cycles, typically last 7-8d and were previously regular. No current hormonal medications/hormonal contraception Vaginal odor - present x 5-39mo. Switched from [place] to a pH balanced soap without improvement   Upstream - 11/12/24 1720       Pregnancy Intention Screening   Does the patient want to become pregnant in the next year? No      Contraception Wrap Up   Current Method Female Condom    End Method Female Condom    Contraception Counseling Provided No    How was the end contraceptive method provided? N/A         The pregnancy intention screening data noted above was reviewed. Potential methods of contraception were discussed. The patient elected to proceed with Female Condom.   Last pap 12/28/23. Results were: NILM w/ HRHPV negative. H/O abnormal pap: no Last mammogram: 01/24/24 - diagnostic for possible breast lump, BI-RADS 1. Family h/o breast cancer: yes mother, menopausal. Patient has had negative genetic testing. Last colonoscopy: n/a. Family h/o colorectal cancer: yes paternal grandmother age 63 HPV vaccine: completed     11/12/2024    3:28 PM 09/06/2024    9:11 AM 07/19/2024   10:56 AM 03/29/2018    1:37 PM 02/03/2018    8:48 AM  Depression screen PHQ 2/9  Decreased Interest 1 0 0 0 0  Down, Depressed, Hopeless 0 0 0 0 0  PHQ - 2 Score 1 0 0 0 0  Altered sleeping 3  2 0 0  Tired, decreased energy 3  2 0 0  Change in appetite 0  3 0 0  Feeling bad or failure about yourself  1  1 0 0  Trouble concentrating 0  0 0 0  Moving  slowly or fidgety/restless 0  0 0 0  Suicidal thoughts 0  0 0 0  PHQ-9 Score 8  8  0  0   Difficult doing work/chores   Somewhat difficult       Data saved with a previous flowsheet row definition        11/12/2024    3:36 PM 09/06/2024    9:11 AM 07/19/2024   10:57 AM 03/29/2018    1:37 PM  GAD 7 : Generalized Anxiety Score  Nervous, Anxious, on Edge 0 0 0 0  Control/stop worrying 1 0 0 0  Worry too much - different things 1 0 1 0  Trouble relaxing 2 0 1 0  Restless 0 0 0 0  Easily annoyed or irritable 1 0 0 0  Afraid - awful might happen 1 0 0 0  Total GAD 7 Score 6 0 2 0  Anxiety Difficulty  Not difficult at all Somewhat difficult    Review of Systems:   Pertinent items are noted in HPI Denies any headaches, blurred vision, fatigue, shortness of breath, chest pain, abdominal pain, abnormal vaginal discharge/itching/odor/irritation, problems with periods, bowel movements, urination, or intercourse unless otherwise stated above. Pertinent History Reviewed:  Reviewed past medical,surgical, social and family history.  Reviewed problem list, medications and allergies. Physical Assessment:  Vitals:   11/12/24 1532  BP: 138/83  Pulse: 88  Weight: 210 lb (95.3 kg)  Body mass index is 37.2 kg/m.        Physical Examination:   General appearance - well appearing, and in no distress  Mental status - alert, oriented to person, place, and time  Chest - respiratory effort normal  Heart - normal peripheral perfusion  Breasts - offered, deferred, recent diagnostic testing  Pelvic - offered, will defer. Will self swab, pelvic US  ordered. Plan for exam next visit pending results/bleeding profile  Results for orders placed or performed in visit on 11/12/24 (from the past 24 hours)  POCT urine pregnancy   Collection Time: 11/12/24  5:00 PM  Result Value Ref Range   Preg Test, Ur Negative Negative    Assessment & Plan:  1) Well-Woman Exam Mammogram: @ 34yo, or sooner if problems,  Alisa model with lifetime breast cancer risk < 20%. Pt has normal genetic testing Colonoscopy: @ 33yo, or sooner if problems Pap: Up to date, due 2030 Gardasil: Completed GC/CT: Collected HIV/HCV: Up to date  2) Abnormal uterine bleeding (AUB) UPT negative today Will pursue work up below Follow up within 1 month to review results Low threshold for EMB next visit esp if symptoms persist (BMI 37) -     Cervicovaginal ancillary only( [place]) -     CBC with Differential/Platelet -     TSH Rfx on Abnormal to Free T4 -     US  PELVIC COMPLETE WITH TRANSVAGINAL; Future -     POCT urine pregnancy  3) Vaginal discharge Discussed general vulvar care -     Cervicovaginal ancillary only( [place])  Labs/procedures today:   Orders Placed This Encounter  Procedures   US  PELVIC COMPLETE WITH TRANSVAGINAL   CBC with Differential/Platelet   TSH Rfx on Abnormal to Free T4   POCT urine pregnancy   Follow-up: Return in about 4 weeks (around 12/10/2024) for follow up gyn with [facility].  Kieth JAYSON Carolin, MD 11/12/2024 5:26 PM

## 2024-11-13 ENCOUNTER — Ambulatory Visit: Payer: Self-pay | Admitting: Obstetrics and Gynecology

## 2024-11-13 LAB — CBC WITH DIFFERENTIAL/PLATELET
Basophils Absolute: 0 x10E3/uL (ref 0.0–0.2)
Basos: 0 %
EOS (ABSOLUTE): 0.3 x10E3/uL (ref 0.0–0.4)
Eos: 3 %
Hematocrit: 45.5 % (ref 34.0–46.6)
Hemoglobin: 14.2 g/dL (ref 11.1–15.9)
Immature Grans (Abs): 0 x10E3/uL (ref 0.0–0.1)
Immature Granulocytes: 0 %
Lymphocytes Absolute: 3.1 x10E3/uL (ref 0.7–3.1)
Lymphs: 33 %
MCH: 26.9 pg (ref 26.6–33.0)
MCHC: 31.2 g/dL — ABNORMAL LOW (ref 31.5–35.7)
MCV: 86 fL (ref 79–97)
Monocytes Absolute: 0.6 x10E3/uL (ref 0.1–0.9)
Monocytes: 6 %
Neutrophils Absolute: 5.4 x10E3/uL (ref 1.4–7.0)
Neutrophils: 58 %
Platelets: 347 x10E3/uL (ref 150–450)
RBC: 5.27 x10E6/uL (ref 3.77–5.28)
RDW: 12.9 % (ref 11.7–15.4)
WBC: 9.4 x10E3/uL (ref 3.4–10.8)

## 2024-11-13 LAB — CERVICOVAGINAL ANCILLARY ONLY
Bacterial Vaginitis (gardnerella): NEGATIVE
Candida Glabrata: NEGATIVE
Candida Vaginitis: NEGATIVE
Chlamydia: NEGATIVE
Comment: NEGATIVE
Comment: NEGATIVE
Comment: NEGATIVE
Comment: NEGATIVE
Comment: NEGATIVE
Comment: NORMAL
Neisseria Gonorrhea: NEGATIVE
Trichomonas: NEGATIVE

## 2024-11-13 LAB — TSH RFX ON ABNORMAL TO FREE T4: TSH: 1.19 u[IU]/mL (ref 0.450–4.500)

## 2024-11-19 ENCOUNTER — Ambulatory Visit (INDEPENDENT_AMBULATORY_CARE_PROVIDER_SITE_OTHER): Payer: Self-pay | Admitting: Nurse Practitioner

## 2024-11-19 VITALS — BP 128/58 | HR 68 | Wt 204.2 lb

## 2024-11-19 DIAGNOSIS — R5383 Other fatigue: Secondary | ICD-10-CM | POA: Diagnosis not present

## 2024-11-19 NOTE — Progress Notes (Signed)
 Subjective   Patient ID: Rachel [name], female    DOB: [birth year], 33 y.o.   MRN: [id number]  Chief Complaint  Patient presents with   Fatigue    Has been going on for about 6-7 months, interfering with job,     Referring provider: Jaycee Greig PARAS, NP  Rachel [name] is a 33 y.o. female with Past Medical History: 2012: Anemia No date: Chlamydia 2011: Depression     Comment:  NO COUNSELING OR MEDS No date: Family history of cancer 12/02/2017: Genetic testing     Comment:  Common Cancers panel (47 genes) @ [facility] - No               pathogenic mutations detected No date: Gonorrhea No date: Heart murmur     Comment:  SINCE BIRTH 2009: Infection     Comment:  CHLAMYDIA 2009: Infection     Comment:  GC No date: Infection     Comment:  YEAST X1 07/10/2012: Lactating mother No date: Miscarriage 07/10/2012: Postpartum anemia   HPI  Patient presents today for an acute visit.  She is complaining of fatigue.  She has already seen her PCP for this in October and her OB/GYN for this 7 days ago.  She has been ordered a sleep study which will be completed in January.  She has had some blood work completed which was overall normal.  We will check a CMP, vitamin D  level, vitamin B12 level today. Denies f/c/s, n/v/d, hemoptysis, PND, leg swelling Denies chest pain or edema       Not on File  Immunization History  Administered Date(s) Administered   HPV Quadrivalent 04/20/2006, 02/13/2008, 03/17/2009   Hep A, Unspecified 04/20/2006, 02/13/2008   Influenza, Seasonal, Injecte, Preservative Fre 12/19/2011   Influenza-Unspecified 02/13/2008, 12/29/2011   Meningococcal Conjugate 03/17/2009   PPD Test 04/22/2020   Td 12/22/2004   Tdap 07/08/2012, 11/23/2017    Tobacco History: Social History   Tobacco Use  Smoking Status Former   Current packs/day: 0.00   Types: Cigarettes   Quit date: 11/02/2011   Years since quitting: 13.0  Smokeless Tobacco Never    Counseling given: Not Answered   Outpatient Encounter Medications as of 11/19/2024  Medication Sig   traZODone  (DESYREL ) 50 MG tablet Take 0.5-1 tablets (25-50 mg total) by mouth at bedtime as needed.   phentermine  30 MG capsule Take 1 capsule (30 mg total) by mouth every morning. (Patient not taking: Reported on 11/19/2024)   No facility-administered encounter medications on file as of 11/19/2024.    Review of Systems  Review of Systems  Constitutional: Negative.   HENT: Negative.    Cardiovascular: Negative.   Gastrointestinal: Negative.   Allergic/Immunologic: Negative.   Neurological: Negative.   Psychiatric/Behavioral: Negative.       Objective:   BP (!) 128/58 (BP Location: Left Arm, Patient Position: Sitting, Cuff Size: Normal)   Pulse 68   Wt 204 lb 3.2 oz (92.6 kg)   LMP 11/01/2024 (Approximate) Comment: irregular; sometimes has two periods in one month  SpO2 100%   BMI 36.17 kg/m   Wt Readings from Last 5 Encounters:  11/19/24 204 lb 3.2 oz (92.6 kg)  11/12/24 210 lb (95.3 kg)  09/06/24 205 lb 9.6 oz (93.3 kg)  07/19/24 203 lb 12.8 oz (92.4 kg)  01/13/24 203 lb (92.1 kg)     Physical Exam Vitals and nursing note reviewed.  Constitutional:      General: She is not in acute distress.  Appearance: She is well-developed.  Cardiovascular:     Rate and Rhythm: Normal rate and regular rhythm.  Pulmonary:     Effort: Pulmonary effort is normal.     Breath sounds: Normal breath sounds.  Neurological:     Mental Status: She is alert and oriented to person, place, and time.       Assessment & Plan:   Other fatigue -     Comprehensive metabolic panel with GFR -     Vitamin B12 -     VITAMIN D  25 Hydroxy (Vit-D Deficiency, Fractures)     Return if symptoms worsen or fail to improve.   Bascom GORMAN Borer, NP 11/19/2024

## 2024-11-20 LAB — COMPREHENSIVE METABOLIC PANEL WITH GFR
ALT: 72 IU/L — ABNORMAL HIGH (ref 0–32)
AST: 45 IU/L — ABNORMAL HIGH (ref 0–40)
Albumin: 4.4 g/dL (ref 3.9–4.9)
Alkaline Phosphatase: 73 IU/L (ref 41–116)
BUN/Creatinine Ratio: 12 (ref 9–23)
BUN: 10 mg/dL (ref 6–20)
Bilirubin Total: 0.3 mg/dL (ref 0.0–1.2)
CO2: 22 mmol/L (ref 20–29)
Calcium: 10.3 mg/dL — ABNORMAL HIGH (ref 8.7–10.2)
Chloride: 102 mmol/L (ref 96–106)
Creatinine, Ser: 0.85 mg/dL (ref 0.57–1.00)
Globulin, Total: 3 g/dL (ref 1.5–4.5)
Glucose: 108 mg/dL — ABNORMAL HIGH (ref 70–99)
Potassium: 4.8 mmol/L (ref 3.5–5.2)
Sodium: 138 mmol/L (ref 134–144)
Total Protein: 7.4 g/dL (ref 6.0–8.5)
eGFR: 92 mL/min/1.73 (ref >59)

## 2024-11-20 LAB — VITAMIN B12: Vitamin B-12: 1482 pg/mL — ABNORMAL HIGH (ref 232–1245)

## 2024-11-20 LAB — VITAMIN D 25 HYDROXY (VIT D DEFICIENCY, FRACTURES): Vit D, 25-Hydroxy: 24.7 ng/mL — ABNORMAL LOW (ref 30.0–100.0)

## 2024-11-22 ENCOUNTER — Ambulatory Visit: Payer: Self-pay | Admitting: Nurse Practitioner

## 2024-12-16 ENCOUNTER — Ambulatory Visit (HOSPITAL_BASED_OUTPATIENT_CLINIC_OR_DEPARTMENT_OTHER): Attending: Family | Admitting: Pulmonary Disease

## 2024-12-16 DIAGNOSIS — R0683 Snoring: Secondary | ICD-10-CM

## 2024-12-16 DIAGNOSIS — G4733 Obstructive sleep apnea (adult) (pediatric): Secondary | ICD-10-CM | POA: Diagnosis present

## 2024-12-22 DIAGNOSIS — R0683 Snoring: Secondary | ICD-10-CM | POA: Diagnosis not present

## 2024-12-22 NOTE — Procedures (Signed)
" °  Indications for Polysomnography The patient is a 34 year old Female who is 5' 3 and weighs 206.0 lbs. Her BMI equals 36.5.  A full night polysomnogram was performed to evaluate for -.  Medications Taken:NO MEDICATIONS TAKEN. Polysomnogram Data A full night polysomnogram recorded the standard physiologic parameters including EEG, EOG, EMG, EKG, nasal and oral airflow.  Respiratory parameters of chest and abdominal movements were recorded with Respiratory Inductance Plethysmography belts.   Oxygen saturation was recorded by pulse oximetry.  Sleep Architecture The total recording time of the polysomnogram was 401.8 minutes.  The total sleep time was 372.0 minutes.  The patient spent 1.9% of total sleep time in Stage N1, 43.8% in Stage N2, 34.8% in Stages N3, and 19.5% in REM.  Sleep latency was 0.0 minutes.   REM latency was 42.4 minutes.  Sleep Efficiency was 92.6%.  Wake after Sleep Onset time was 29.5 minutes.  Respiratory Events The polysomnogram revealed a presence of - obstructive, - central, and - mixed apneas resulting in an Apnea index of - events per hour.  There were 83 hypopneas (GreaterEqual to3% desaturation and/or arousal) resulting in an Apnea\Hypopnea Index (AHI  GreaterEqual to3% desaturation and/or arousal) of 13.4 events per hour.  There were 38 hypopneas (GreaterEqual to4% desaturation) resulting in an Apnea\Hypopnea Index (AHI GreaterEqual to4% desaturation) of 6.1 events per hour.  There were 100  Respiratory Effort Related Arousals resulting in a RERA index of 16.1 events per hour. The Respiratory Disturbance Index is 29.5 events per hour.  The snore index was - events per hour.  Mean oxygen saturation was 95.7%.  The lowest oxygen saturation during sleep was 88.0%.  Time spent LessEqual to88% oxygen saturation was  minutes ().  Limb Activity There were - total limb movements recorded, of this total, - were classified as PLMs.  PLM index was - per hour and PLM associated  with Arousals index was - per hour.  Cardiac Summary The average pulse rate was 66.4 bpm.  The minimum pulse rate was 44.0 bpm while the maximum pulse rate was 97.0 bpm.  Cardiac rhythm was normal/abnormal.  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 Auto CPAP 5-15 with heated humidification and the patient's preferred mask may be considered; other treatment  options may include an oral device, watchful waiting with significant weight loss efforts. Avoid alcohol, sedatives and other CNS depressants that may worsen sleep apnea and disrupt normal sleep architecture. Sleep hygiene should be reviewed to assess factors that may improve sleep quality. Weight management and regular exercise should be initiated or continued  Clinical follow-up for transition of treatment  This study was personally reviewed and electronically signed by: NEDA JENNET LABOR, MD Accredited Board Certified in Sleep Medicine Date/Time:  12/22/24 "

## 2024-12-22 NOTE — Procedures (Signed)
 " [facility] [address] [place], [place] [address] Tel: [phone]   Fax: [phone]  Polysomnography Interpretation  Patient Name:  Rachel [name], Rachel [name] Date:  12/16/2024 Referring Physician:  AMY MASSEY [phone]) %%startinterp%% Indications for Polysomnography The patient is a 34 year old Female who is 5' 3 and weighs 206.0 lbs. Her BMI equals 36.5.  A full night polysomnogram was performed to evaluate for -.  Medications Taken:  NO MEDICATIONS TAKEN.   Polysomnogram Data A full night polysomnogram recorded the standard physiologic parameters including EEG, EOG, EMG, EKG, nasal and oral airflow.  Respiratory parameters of chest and abdominal movements were recorded with Respiratory Inductance Plethysmography belts.  Oxygen saturation was recorded by pulse oximetry.   Sleep Architecture The total recording time of the polysomnogram was 401.8 minutes.  The total sleep time was 372.0 minutes.  The patient spent 1.9% of total sleep time in Stage N1, 43.8% in Stage N2, 34.8% in Stages N3, and 19.5% in REM.  Sleep latency was 0.0 minutes.  REM latency was 42.4 minutes.  Sleep Efficiency was 92.6%.  Wake after Sleep Onset time was 29.5 minutes.  Respiratory Events The polysomnogram revealed a presence of - obstructive, - central, and - mixed apneas resulting in an Apnea index of - events per hour.  There were 83 hypopneas (>=3% desaturation and/or arousal) resulting in an Apnea\Hypopnea Index (AHI >=3% desaturation and/or arousal) of 13.4 events per hour.  There were 38 hypopneas (>=4% desaturation) resulting in an Apnea\Hypopnea Index (AHI >=4% desaturation) of 6.1 events per hour.  There were 100 Respiratory Effort Related Arousals resulting in a RERA index of 16.1 events per hour. The Respiratory Disturbance Index is 29.5 events per hour.  The snore index was - events per hour.  Mean oxygen saturation was 95.7%.  The lowest oxygen saturation  during sleep was 88.0%.  Time spent <=88% oxygen saturation was - minutes (-).  Limb Activity There were - total limb movements recorded, of this total, - were classified as PLMs.  PLM index was - per hour and PLM associated with Arousals index was - per hour.  Cardiac Summary The average pulse rate was 66.4 bpm.  The minimum pulse rate was 44.0 bpm while the maximum pulse rate was 97.0 bpm.  Cardiac rhythm was normal/abnormal.  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 Auto CPAP 5-15 with heated humidification and the patient's preferred mask may be considered; other treatment options may include an oral device, watchful waiting with significant weight loss efforts. Avoid alcohol, sedatives and other CNS depressants that may worsen sleep apnea and disrupt normal sleep architecture. Sleep hygiene should be reviewed to assess factors that may improve sleep quality. Weight management and regular exercise should be initiated or continued  Clinical follow-up for transition of treatment  This study was personally reviewed and electronically signed by: NEDA JENNET LABOR, MD Accredited Board Certified in Sleep Medicine Date/Time:   12/22/2024    Diagnostic PSG Report  Patient Name: Rachel [name], Rachel [name] Study Date: 12/16/2024  Date of Birth: 1991-07-11 Study Type: Diagnostic  Age: 28 year MRN #: [id number]  Sex: Female Interpreting Physician: NEDA JENNET,  [id number]  Height: 5' 3 Referring Physician: AMY MASSEY ([id number])  Weight: 206.0 lbs Recording Tech: Charlie George RPSGT  BMI:  36.5 Scoring Tech: Charlie George RPSGT  ESS: 22/24 Neck Size: 15.5   Study Overview  Lights Off: 10:27:54 PM  Count Index  Lights On: 05:09:39 AM Awakenings: 12 1.9  Time in Bed: 401.8 min. Arousals: 140 22.6  Total Sleep Time: 372.0 min. AHI (>=3% Desat and/or Ar.): 83 13.4   Sleep Efficiency: 92.6% AHI (>=4% Desat): 38 6.1   Sleep Latency: 0.0 min. Limb Movements: - -  Wake After Sleep Onset: 29.5 min. Snore: - -  REM Latency from Sleep Onset: 42.4 min. Desaturations: 85 13.7     Minimum SpO2 TST: 88.0%    Sleep Architecture  % of Time in Bed Stages Time (mins) % Sleep Time  Wake 30.0   Stage N1 7.0 1.9%  Stage N2 163.0 43.8%  Stage N3 129.5 34.8%  REM 72.5 19.5%   Arousal Summary   NREM REM Sleep Index  Respiratory Arousals 115 23 138 22.3  PLM Arousals - - - -  Isolated Limb Movement Arousals - - - -  Snore Arousals - - - -  Spontaneous Arousals 2 - 2 0.3  Total 117 23 140 22.6   Limb Movement Summary   Count Index  Isolated Limb Movements - -  Periodic Limb Movements (PLMs) - -  Total Limb Movements - -    Respiratory Summary   By Sleep Stage By Body Position Total   NREM REM Supine Non-Supine   Time (min) 299.5 72.5 236.0 136.0 372.0         Obstructive Apnea - - - - -  Mixed Apnea - - - - -  Central Apnea - - - - -  Total Apneas - - - - -  Total Apnea Index - - - - -         Hypopneas (>=3% Desat and/or Ar.) 66 17 57 26 83  AHI (>=3% Desat and/or Ar.) 13.2 14.1 14.5 11.5 13.4         Hypopneas (>=4% Desat) 28 10 26 12  38  AHI (>=4% Desat) 5.6 8.3 6.6 5.3 6.1          RERAs 78 22 60 40 100  RERA Index 15.6 18.2 15.3 17.6 16.1         RDI 28.8 32.3 29.7 29.1 29.5    Respiratory Event Type Index  Central Apneas -  Obstructive Apneas -  Mixed Apneas -  Central Hypopneas -  Obstructive Hypopneas 13.4  Central Apnea + Hypopnea (CAHI) -  Obstructive Apnea + Hypopnea (OAHI) 13.4   Respiratory Event Durations   Apnea Hypopnea   NREM REM  NREM REM  Average (seconds) - - 32.1 27.9  Maximum (seconds) - - 70.8 50.6    Oxygen Saturation Summary   Wake NREM REM TST TIB  Average SpO2 (%) 96.9% 95.7% 95.2% 95.6% 95.7%  Minimum SpO2 (%) 91.0% 88.0% 91.0% 88.0% 88.0%  Maximum SpO2 (%) 99.0% 100.0% 99.0% 100.0% 100.0%   Oxygen Saturation Distribution  Range (%) Time in range (min) Time in range (%)  90.0 - 100.0 397.2 99.1%  80.0 - 90.0 0.2 0.1%  70.0 - 80.0 - -  60.0 - 70.0 - -  50.0 - 60.0 - -  0.0 - 50.0 - -  Time Spent <=88% SpO2  Range (%) Time in range (min) Time in range (%)  0.0 - 88.0 0.0 0.0%  Count Index  Desaturations 85 13.7    Cardiac Summary   Wake NREM REM Sleep Total  Average Pulse Rate (BPM) 67.2 66.8 64.4 66.3 66.4  Minimum Pulse Rate (BPM) 47.0 44.0 47.0 44.0 44.0  Maximum Pulse Rate (BPM) 96.0 97.0 80.0 97.0 97.0   Pulse Rate Distribution:  Range (bpm) Time in range (min) Time in range (%)  0.0 - 40.0 - -  40.0 - 60.0 70.3 17.5%  60.0 - 80.0 323.8 80.7%  80.0 - 100.0 3.9 1.0%  100.0 - 120.0 - -  120.0 - 140.0 - -  140.0 - 200.0 - -      Hypnograms                      Technologist Comments            The patient arrived for a diagnostic PSG study. The patient was placed in room 5. The procedure was explained, and the patient had no questions.            The patient slept in the left, right, and supine positions. Bruxism was observed. No oral venting, PLMs, seizure, or spike wave activity was observed. Snoring was severe and audible. No ECG events were observed. All stages of sleep were recorded. The patient had no bathroom breaks.   "

## 2024-12-24 ENCOUNTER — Ambulatory Visit: Payer: Self-pay | Admitting: Family

## 2024-12-27 ENCOUNTER — Ambulatory Visit: Admitting: Obstetrics and Gynecology

## 2024-12-27 ENCOUNTER — Encounter: Payer: Self-pay | Admitting: Obstetrics and Gynecology

## 2024-12-27 VITALS — BP 155/76 | HR 67 | Ht 63.0 in | Wt 209.0 lb

## 2024-12-27 DIAGNOSIS — N939 Abnormal uterine and vaginal bleeding, unspecified: Secondary | ICD-10-CM

## 2024-12-27 NOTE — Progress Notes (Signed)
" ° °  RETURN GYNECOLOGY VISIT  Subjective:  Rachel [name] is a 34 y.o. H6E7987 with LMP 12/24/24 presenting for follow up of AUB  Seen 11/12/24 for annual and pt reported an episode of q2wk periods x 3 months. She then had a normal cycle from Nov-Dec and Dec-Jan. Typically cycles are 7-8d and have been painful for a long time. CBC/TSH normal, UPT negative, and GC/CT/trich testing negative. She was planned for pelvic US  but it hasn't been completed yet (confusion about scheduling process).   Today, reports she is doing well and cycle has been back to normal. No further intermenstrual bleeding/short cycles.    I personally reviewed from 11/12/24: - Hgb 14.2 - TSH 1.19 - GC/CG/trich negative - UPT negative  Pap NILM/HPV neg 12/28/23  Objective:   Vitals:   12/27/24 1554  BP: (!) 155/76  Pulse: 67  Weight: 209 lb (94.8 kg)  Height: 5' 3 (1.6 m)   General:  Alert, oriented and cooperative. Patient is in no acute distress.  Skin: Skin is warm and dry. No rash noted.   Cardiovascular: Normal heart rate noted  Respiratory: Normal respiratory effort, no problems with respiration noted  Abdomen: Soft, non-tender, non-distended    Assessment and Plan:  Rachel [name] is a 34 y.o. here for AUB follow up  Will get pelvic US  Discussed pros/cons of EMB - only way to r/o malignancy/hyperplasia, but if bleeding pattern remains normal and US  is reassuring it may be unnecessary. She feels comfortable monitoring her symptoms Continue to monitor bleeding - if any recurrence of AUB, she will need EMB  Return in about 1 year (around 12/27/2025) for annual exam or sooner if bleeding comes back.  Future Appointments  Date Time Provider Department Center  12/31/2024  4:00 PM [facility]  1 [facility]  [facility]   Kieth JAYSON Carolin, MD  "

## 2024-12-31 ENCOUNTER — Ambulatory Visit (HOSPITAL_COMMUNITY)
Admission: RE | Admit: 2024-12-31 | Discharge: 2024-12-31 | Disposition: A | Discharge status: Home / Self Care | Source: Ambulatory Visit | Attending: Obstetrics and Gynecology | Admitting: Obstetrics and Gynecology

## 2024-12-31 DIAGNOSIS — N939 Abnormal uterine and vaginal bleeding, unspecified: Secondary | ICD-10-CM | POA: Insufficient documentation

## 2025-01-15 ENCOUNTER — Encounter (HOSPITAL_BASED_OUTPATIENT_CLINIC_OR_DEPARTMENT_OTHER): Payer: Self-pay | Admitting: Emergency Medicine

## 2025-01-15 ENCOUNTER — Emergency Department (HOSPITAL_BASED_OUTPATIENT_CLINIC_OR_DEPARTMENT_OTHER)
Admission: EM | Admit: 2025-01-15 | Discharge: 2025-01-15 | Disposition: A | Discharge status: Home / Self Care | Attending: Emergency Medicine | Admitting: Emergency Medicine

## 2025-01-15 ENCOUNTER — Other Ambulatory Visit: Payer: Self-pay

## 2025-01-15 ENCOUNTER — Emergency Department (HOSPITAL_BASED_OUTPATIENT_CLINIC_OR_DEPARTMENT_OTHER): Admitting: Radiology

## 2025-01-15 DIAGNOSIS — D72829 Elevated white blood cell count, unspecified: Secondary | ICD-10-CM | POA: Insufficient documentation

## 2025-01-15 DIAGNOSIS — R0789 Other chest pain: Secondary | ICD-10-CM | POA: Insufficient documentation

## 2025-01-15 LAB — BASIC METABOLIC PANEL WITH GFR
Anion gap: 12 (ref 5–15)
BUN: 12 mg/dL (ref 6–20)
CO2: 25 mmol/L (ref 22–32)
Calcium: 10.1 mg/dL (ref 8.9–10.3)
Chloride: 101 mmol/L (ref 98–111)
Creatinine, Ser: 0.75 mg/dL (ref 0.44–1.00)
GFR, Estimated: >60 mL/min
Glucose, Bld: 172 mg/dL — ABNORMAL HIGH (ref 70–99)
Potassium: 3.7 mmol/L (ref 3.5–5.1)
Sodium: 139 mmol/L (ref 135–145)

## 2025-01-15 LAB — CBC
HCT: 40.6 % (ref 36.0–46.0)
Hemoglobin: 13.4 g/dL (ref 12.0–15.0)
MCH: 27.2 pg (ref 26.0–34.0)
MCHC: 33 g/dL (ref 30.0–36.0)
MCV: 82.5 fL (ref 80.0–100.0)
Platelets: 324 10*3/uL (ref 150–400)
RBC: 4.92 MIL/uL (ref 3.87–5.11)
RDW: 13.5 % (ref 11.5–15.5)
WBC: 11 10*3/uL — ABNORMAL HIGH (ref 4.0–10.5)
nRBC: 0 % (ref 0.0–0.2)

## 2025-01-15 LAB — TROPONIN T, HIGH SENSITIVITY
Troponin T High Sensitivity: <6 ng/L (ref 0–19)
Troponin T High Sensitivity: <6 ng/L (ref 0–19)

## 2025-01-15 LAB — PREGNANCY, URINE: Preg Test, Ur: NEGATIVE

## 2025-01-15 LAB — D-DIMER, QUANTITATIVE: D-Dimer, Quant: <0.27 ug{FEU}/mL (ref 0.00–0.50)

## 2025-01-15 MED ORDER — IBUPROFEN 800 MG PO TABS
800.0000 mg | ORAL_TABLET | Freq: Once | ORAL | Status: AC
  Administered 2025-01-15: 800 mg via ORAL
  Filled 2025-01-15: qty 1

## 2025-01-15 MED ORDER — METHOCARBAMOL 500 MG PO TABS: 500.0000 mg | ORAL_TABLET | Freq: Four times a day (QID) | ORAL | 0 refills | Status: AC

## 2025-01-15 MED ORDER — IBUPROFEN 800 MG PO TABS: 800.0000 mg | ORAL_TABLET | Freq: Three times a day (TID) | ORAL | 0 refills | Status: AC | PRN

## 2025-01-15 NOTE — ED Triage Notes (Signed)
 Chest pain x 2 weeks getting worse Worse with activity Denies sob, no n/v no sweating

## 2025-01-15 NOTE — Discharge Instructions (Addendum)
 Return if any problems.  Follow up with your Physician for recheck.
